# Patient Record
Sex: Female | Born: 1975
Health system: Southern US, Community
[De-identification: ages and names within clinical notes are randomized; demographics above are authoritative.]

## PROBLEM LIST (undated history)

## (undated) DIAGNOSIS — J069 Acute upper respiratory infection, unspecified: Secondary | ICD-10-CM

## (undated) DIAGNOSIS — J189 Pneumonia, unspecified organism: Secondary | ICD-10-CM

## (undated) DIAGNOSIS — E079 Disorder of thyroid, unspecified: Secondary | ICD-10-CM

## (undated) DIAGNOSIS — R011 Cardiac murmur, unspecified: Secondary | ICD-10-CM

## (undated) DIAGNOSIS — J329 Chronic sinusitis, unspecified: Secondary | ICD-10-CM

## (undated) DIAGNOSIS — Z319 Encounter for procreative management, unspecified: Secondary | ICD-10-CM

## (undated) DIAGNOSIS — E119 Type 2 diabetes mellitus without complications: Secondary | ICD-10-CM

## (undated) HISTORY — PX: WISDOM TOOTH EXTRACTION: SHX21

---

## 2008-12-18 ENCOUNTER — Ambulatory Visit: Payer: Self-pay | Admitting: Obstetrics & Gynecology

## 2013-08-24 ENCOUNTER — Inpatient Hospital Stay: Payer: Self-pay

## 2013-08-24 LAB — PIH PROFILE
AST: 11 U/L — AB (ref 15–37)
Anion Gap: 8 (ref 7–16)
BUN: 4 mg/dL — AB (ref 7–18)
Calcium, Total: 8.4 mg/dL — ABNORMAL LOW (ref 8.5–10.1)
Chloride: 104 mmol/L (ref 98–107)
Co2: 27 mmol/L (ref 21–32)
Creatinine: 0.83 mg/dL (ref 0.60–1.30)
EGFR (African American): >60
EGFR (Non-African Amer.): >60
GLUCOSE: 127 mg/dL — AB (ref 65–99)
HCT: 30.5 % — ABNORMAL LOW (ref 35.0–47.0)
HGB: 10.3 g/dL — ABNORMAL LOW (ref 12.0–16.0)
MCH: 29.6 pg (ref 26.0–34.0)
MCHC: 33.8 g/dL (ref 32.0–36.0)
MCV: 88 fL (ref 80–100)
Osmolality: 276 (ref 275–301)
PLATELETS: 225 10*3/uL (ref 150–440)
POTASSIUM: 2.8 mmol/L — AB (ref 3.5–5.1)
RBC: 3.48 10*6/uL — AB (ref 3.80–5.20)
RDW: 13.3 % (ref 11.5–14.5)
Sodium: 139 mmol/L (ref 136–145)
Uric Acid: 4.4 mg/dL (ref 2.6–6.0)
WBC: 11.2 10*3/uL — ABNORMAL HIGH (ref 3.6–11.0)

## 2013-08-24 LAB — PROTEIN / CREATININE RATIO, URINE
CREATININE, URINE: 42.3 mg/dL (ref 30.0–125.0)
Protein, Random Urine: 5 mg/dL (ref 0–12)
Protein/Creat. Ratio: 118 mg/gCREAT (ref 0–200)

## 2013-08-25 LAB — BASIC METABOLIC PANEL
ANION GAP: 7 (ref 7–16)
BUN: 4 mg/dL — AB (ref 7–18)
CO2: 27 mmol/L (ref 21–32)
CREATININE: 0.76 mg/dL (ref 0.60–1.30)
Calcium, Total: 8.4 mg/dL — ABNORMAL LOW (ref 8.5–10.1)
Chloride: 105 mmol/L (ref 98–107)
EGFR (Non-African Amer.): >60
GLUCOSE: 83 mg/dL (ref 65–99)
Osmolality: 274 (ref 275–301)
Potassium: 3 mmol/L — ABNORMAL LOW (ref 3.5–5.1)
Sodium: 139 mmol/L (ref 136–145)

## 2013-08-26 LAB — HEMATOCRIT: HCT: 30.9 % — AB (ref 35.0–47.0)

## 2013-08-26 LAB — POTASSIUM: Potassium: 3.5 mmol/L (ref 3.5–5.1)

## 2014-10-09 NOTE — H&P (Signed)
L&D Evaluation:  History:  HPI 39 yo at 37+6 week seen in office with scotomata and h/a today. Pt has been followed for gest HTN , AMA , obesity, Anemia. BP on L+D 140s / 80-90s. K+ = 2.8   Medications aldomet 250 mg TID   Allergies NKDA   Social History none   Family History Non-Contributory   Exam:  Vital Signs BP >140/90   General no apparent distress   Mental Status clear   Chest clear   Heart normal sinus rhythm   Fetal Position vtx   Edema 1+   Pelvic tft/ 80/-3 vtx   Mebranes Intact   FHT normal rate with no decels, reactive   Ucx irregular   Impression:  Impression HTN  with CNS sx earlier resolved. Hypokalemia etiology?   Plan:  Plan admit and induce given her profound hypokalemia  and Htn . Cervidil tonight, PO K+   Electronic Signatures: Schermerhorn, Ihor Austinhomas J (MD)  (Signed 26-Mar-15 19:20)  Authored: L&D Evaluation   Last Updated: 26-Mar-15 19:20 by Suzy BouchardSchermerhorn, Thomas J (MD)

## 2016-10-13 ENCOUNTER — Observation Stay
Admission: EM | Admit: 2016-10-13 | Discharge: 2016-10-14 | DRG: 871 | Disposition: A | Discharge status: Home / Self Care | Payer: BLUE CROSS/BLUE SHIELD | Attending: Internal Medicine | Admitting: Internal Medicine

## 2016-10-13 ENCOUNTER — Encounter: Payer: Self-pay | Admitting: Emergency Medicine

## 2016-10-13 ENCOUNTER — Emergency Department: Payer: BLUE CROSS/BLUE SHIELD

## 2016-10-13 DIAGNOSIS — Z7984 Long term (current) use of oral hypoglycemic drugs: Secondary | ICD-10-CM

## 2016-10-13 DIAGNOSIS — J189 Pneumonia, unspecified organism: Secondary | ICD-10-CM | POA: Diagnosis present

## 2016-10-13 DIAGNOSIS — R509 Fever, unspecified: Secondary | ICD-10-CM

## 2016-10-13 DIAGNOSIS — Z79899 Other long term (current) drug therapy: Secondary | ICD-10-CM | POA: Diagnosis not present

## 2016-10-13 DIAGNOSIS — J069 Acute upper respiratory infection, unspecified: Secondary | ICD-10-CM | POA: Diagnosis present

## 2016-10-13 DIAGNOSIS — E282 Polycystic ovarian syndrome: Secondary | ICD-10-CM | POA: Diagnosis present

## 2016-10-13 DIAGNOSIS — R000 Tachycardia, unspecified: Secondary | ICD-10-CM

## 2016-10-13 DIAGNOSIS — E039 Hypothyroidism, unspecified: Secondary | ICD-10-CM | POA: Diagnosis present

## 2016-10-13 DIAGNOSIS — A419 Sepsis, unspecified organism: Secondary | ICD-10-CM | POA: Diagnosis not present

## 2016-10-13 DIAGNOSIS — E876 Hypokalemia: Secondary | ICD-10-CM | POA: Diagnosis present

## 2016-10-13 HISTORY — DX: Chronic sinusitis, unspecified: J32.9

## 2016-10-13 HISTORY — DX: Pneumonia, unspecified organism: J18.9

## 2016-10-13 HISTORY — DX: Acute upper respiratory infection, unspecified: J06.9

## 2016-10-13 LAB — CBC WITH DIFFERENTIAL/PLATELET
Basophils Absolute: 0.1 10*3/uL (ref 0–0.1)
Basophils Relative: 1 %
Eosinophils Absolute: 0.5 10*3/uL (ref 0–0.7)
Eosinophils Relative: 4 %
HEMATOCRIT: 40.6 % (ref 35.0–47.0)
HEMOGLOBIN: 13.8 g/dL (ref 12.0–16.0)
LYMPHS ABS: 1.7 10*3/uL (ref 1.0–3.6)
LYMPHS PCT: 14 %
MCH: 30.2 pg (ref 26.0–34.0)
MCHC: 33.9 g/dL (ref 32.0–36.0)
MCV: 89 fL (ref 80.0–100.0)
MONOS PCT: 7 %
Monocytes Absolute: 0.9 10*3/uL (ref 0.2–0.9)
NEUTROS ABS: 9.5 10*3/uL — AB (ref 1.4–6.5)
NEUTROS PCT: 74 %
Platelets: 293 10*3/uL (ref 150–440)
RBC: 4.56 MIL/uL (ref 3.80–5.20)
RDW: 13.3 % (ref 11.5–14.5)
WBC: 12.8 10*3/uL — AB (ref 3.6–11.0)

## 2016-10-13 LAB — URINALYSIS, COMPLETE (UACMP) WITH MICROSCOPIC
Bacteria, UA: NONE SEEN
Bilirubin Urine: NEGATIVE
GLUCOSE, UA: NEGATIVE mg/dL
KETONES UR: NEGATIVE mg/dL
LEUKOCYTES UA: NEGATIVE
Nitrite: NEGATIVE
PROTEIN: NEGATIVE mg/dL
Specific Gravity, Urine: 1.013 (ref 1.005–1.030)
pH: 6 (ref 5.0–8.0)

## 2016-10-13 LAB — BASIC METABOLIC PANEL
ANION GAP: 9 (ref 5–15)
BUN: 7 mg/dL (ref 6–20)
CHLORIDE: 102 mmol/L (ref 101–111)
CO2: 25 mmol/L (ref 22–32)
CREATININE: 0.57 mg/dL (ref 0.44–1.00)
Calcium: 8.7 mg/dL — ABNORMAL LOW (ref 8.9–10.3)
GFR calc non Af Amer: >60 mL/min (ref >60)
Glucose, Bld: 128 mg/dL — ABNORMAL HIGH (ref 65–99)
POTASSIUM: 3.5 mmol/L (ref 3.5–5.1)
SODIUM: 136 mmol/L (ref 135–145)

## 2016-10-13 LAB — POCT PREGNANCY, URINE: PREG TEST UR: NEGATIVE

## 2016-10-13 LAB — INFLUENZA PANEL BY PCR (TYPE A & B)
INFLBPCR: NEGATIVE
Influenza A By PCR: NEGATIVE

## 2016-10-13 LAB — LACTIC ACID, PLASMA: LACTIC ACID, VENOUS: 2.5 mmol/L — AB (ref 0.5–1.9)

## 2016-10-13 MED ORDER — VANCOMYCIN HCL IN DEXTROSE 1-5 GM/200ML-% IV SOLN
1000.0000 mg | Freq: Once | INTRAVENOUS | Status: AC
  Administered 2016-10-14: 1000 mg via INTRAVENOUS
  Filled 2016-10-13: qty 200

## 2016-10-13 MED ORDER — PIPERACILLIN-TAZOBACTAM 3.375 G IVPB 30 MIN
3.3750 g | Freq: Once | INTRAVENOUS | Status: AC
  Administered 2016-10-14: 3.375 g via INTRAVENOUS
  Filled 2016-10-13: qty 50

## 2016-10-13 MED ORDER — SODIUM CHLORIDE 0.9 % IV BOLUS (SEPSIS)
1000.0000 mL | Freq: Once | INTRAVENOUS | Status: AC
  Administered 2016-10-13: 1000 mL via INTRAVENOUS

## 2016-10-13 MED ORDER — ACETAMINOPHEN 500 MG PO TABS
1000.0000 mg | ORAL_TABLET | Freq: Once | ORAL | Status: AC
  Administered 2016-10-13: 1000 mg via ORAL
  Filled 2016-10-13: qty 2

## 2016-10-13 NOTE — ED Notes (Signed)
ED Provider at bedside. 

## 2016-10-13 NOTE — ED Notes (Signed)
Pt reports a month ago she had a sinus infection that developed into an upper resp infection which led to "a touch of pneumonia." Pt states she was put on antibiotics as well as steroids and reports improvement in symptoms. Pt states today she developed a fever today as well as being achy and increased weakness. [facility] and [facility] to ED for elevated BP, pt also sinus tach.

## 2016-10-13 NOTE — ED Notes (Signed)
Pt states she would like get an update from EDP, EDP notified and states he will be in to see pt. Pt notified of this.

## 2016-10-13 NOTE — ED Provider Notes (Signed)
[facility] Emergency Department Provider Note   ____________________________________________   I have reviewed the triage vital signs and the nursing notes.   HISTORY  Chief Complaint Hypertension; Fever; Generalized Body Aches; and Chills   History limited by: Not Limited   HPI Theresa Skinner is a 41 y.o. female who presents to the emergency department today because of concerns for high blood pressure noted at her noted a clinic. The patient went to coming out of clinic today because of concerns for fevers and body aches. The patient states that the symptoms started this afternoon. She had been feeling well earlier in the day. The first symptom she noticed was chills. This was then accompanied by diffuse body aches. It is located in all 4 extremities. She is also had fevers. She denies taking any medication for fevers. She states that she does camp however has not noticed any ticks. No known sick contacts. She denies any dysuria or rashes.   History reviewed. No pertinent past medical history.  There are no active problems to display for this patient.   History reviewed. No pertinent surgical history.  Prior to Admission medications   Not on File    Allergies Patient has no known allergies.  No family history on file.  Social History Social History  Substance Use Topics  . Smoking status: Never Smoker  . Smokeless tobacco: Never Used  . Alcohol use Yes    Review of Systems Constitutional: Positive for fever and chills. Eyes: No visual changes. ENT: No sore throat. Cardiovascular: Denies chest pain. Respiratory: Denies shortness of breath. Gastrointestinal: No abdominal pain.  No nausea, no vomiting.  No diarrhea.   Genitourinary: Negative for dysuria. Musculoskeletal: Positive for myalgias.  Skin: Negative for rash. Neurological: Negative for headaches, focal weakness or  numbness.  ____________________________________________   PHYSICAL EXAM:  VITAL SIGNS: ED Triage Vitals  Enc Vitals Group     BP 10/13/16 1942 (!) 151/98     Pulse Rate 10/13/16 1942 (!) 143     Resp 10/13/16 1942 18     Temp 10/13/16 1942 99.5 F (37.5 C)     Temp Source 10/13/16 1942 Oral     SpO2 10/13/16 1942 98 %     Weight 10/13/16 1944 268 lb (121.6 kg)     Height 10/13/16 1944 5\' 6"  (1.676 m)     Head Circumference --      Peak Flow --      Pain Score 10/13/16 1944 6   Constitutional: Alert and oriented. Well appearing and in no distress. Eyes: Conjunctivae are normal.  ENT   Head: Normocephalic and atraumatic.   Nose: No congestion/rhinnorhea.   Mouth/Throat: Mucous membranes are moist.   Neck: No stridor. Hematological/Lymphatic/Immunilogical: No cervical lymphadenopathy. Cardiovascular: Tachycardic, regular rhythm.  No murmurs, rubs, or gallops. Respiratory: Normal respiratory effort without tachypnea nor retractions. Breath sounds are clear and equal bilaterally. No wheezes/rales/rhonchi. Gastrointestinal: Soft and non tender. No rebound. No guarding.  Genitourinary: Deferred Musculoskeletal: Normal range of motion in all extremities. No lower extremity edema. Neurologic:  Normal speech and language. No gross focal neurologic deficits are appreciated.  Skin:  Skin is warm, dry and intact. No rash noted. Psychiatric: Mood and affect are normal. Speech and behavior are normal. Patient exhibits appropriate insight and judgment.  ____________________________________________    LABS (pertinent positives/negatives)  Labs Reviewed  BASIC METABOLIC PANEL - Abnormal; Notable for the following:       Result Value   Glucose, Bld  128 (*)    Calcium 8.7 (*)    All other components within normal limits  CBC WITH DIFFERENTIAL/PLATELET - Abnormal; Notable for the following:    WBC 12.8 (*)    Neutro Abs 9.5 (*)    All other components within normal limits   LACTIC ACID, PLASMA - Abnormal; Notable for the following:    Lactic Acid, Venous 2.5 (*)    All other components within normal limits  URINALYSIS, COMPLETE (UACMP) WITH MICROSCOPIC - Abnormal; Notable for the following:    Color, Urine YELLOW (*)    APPearance CLEAR (*)    Hgb urine dipstick SMALL (*)    Squamous Epithelial / LPF 0-5 (*)    All other components within normal limits  CULTURE, BLOOD (ROUTINE X 2)  CULTURE, BLOOD (ROUTINE X 2)  INFLUENZA PANEL BY PCR (TYPE A & B)  LACTIC ACID, PLASMA  POCT PREGNANCY, URINE     ____________________________________________   EKG  I, Phineas SemenGraydon Maurene Hollin, attending physician, personally viewed and interpreted this EKG  EKG Time: 1945 Rate: 138 Rhythm: sinus tachycardia Axis: normal Intervals: qtc 412 QRS: narrow ST changes: no st elevation Impression: abnormal ekg   ____________________________________________    RADIOLOGY  CXR IMPRESSION:  No active cardiopulmonary disease.      ____________________________________________   PROCEDURES  Procedures  ____________________________________________   INITIAL IMPRESSION / ASSESSMENT AND PLAN / ED COURSE  Pertinent labs & imaging results that were available during my care of the patient were reviewed by me and considered in my medical decision making (see chart for details).  Patient presents to the emergency department today because of concerns for fevers and myalgias. Patient was tachycardic and febrile initial presentation. Additionally patient's lactic acid and leukocytosis for elevated. Unclear source at this point. Patient will be given broad-spectrum antibiotics. Patient will be given IV fluids and admitted to the hospitalist service.  ____________________________________________   FINAL CLINICAL IMPRESSION(S) / ED DIAGNOSES  Final diagnoses:  Fever, unspecified fever cause  Tachycardia     Note: This dictation was prepared with Dragon dictation. Any  transcriptional errors that result from this process are unintentional     Phineas SemenGoodman, Kian Gamarra, MD 10/13/16 2336

## 2016-10-13 NOTE — ED Triage Notes (Signed)
[facility]. Pt states she was initially seen for fever chills and body aches; onset earlier today. Denies chest pain or cough. Has not taken any otc medications today for her symptoms.

## 2016-10-14 ENCOUNTER — Encounter: Payer: Self-pay | Admitting: *Deleted

## 2016-10-14 DIAGNOSIS — A419 Sepsis, unspecified organism: Secondary | ICD-10-CM | POA: Diagnosis present

## 2016-10-14 LAB — BASIC METABOLIC PANEL
ANION GAP: 6 (ref 5–15)
BUN: 6 mg/dL (ref 6–20)
CALCIUM: 8.1 mg/dL — AB (ref 8.9–10.3)
CHLORIDE: 106 mmol/L (ref 101–111)
CO2: 26 mmol/L (ref 22–32)
CREATININE: 0.67 mg/dL (ref 0.44–1.00)
GFR calc Af Amer: >60 mL/min (ref >60)
GFR calc non Af Amer: >60 mL/min (ref >60)
GLUCOSE: 109 mg/dL — AB (ref 65–99)
Potassium: 3.3 mmol/L — ABNORMAL LOW (ref 3.5–5.1)
Sodium: 138 mmol/L (ref 135–145)

## 2016-10-14 LAB — CBC
HCT: 37.1 % (ref 35.0–47.0)
HEMOGLOBIN: 12.7 g/dL (ref 12.0–16.0)
MCH: 29.9 pg (ref 26.0–34.0)
MCHC: 34.2 g/dL (ref 32.0–36.0)
MCV: 87.3 fL (ref 80.0–100.0)
Platelets: 273 10*3/uL (ref 150–440)
RBC: 4.25 MIL/uL (ref 3.80–5.20)
RDW: 13.1 % (ref 11.5–14.5)
WBC: 9.4 10*3/uL (ref 3.6–11.0)

## 2016-10-14 LAB — PROTIME-INR
INR: 1.03
Prothrombin Time: 13.5 seconds (ref 11.4–15.2)

## 2016-10-14 LAB — LACTIC ACID, PLASMA
LACTIC ACID, VENOUS: 2.8 mmol/L — AB (ref 0.5–1.9)
Lactic Acid, Venous: 1.3 mmol/L (ref 0.5–1.9)

## 2016-10-14 LAB — APTT: aPTT: 28 seconds (ref 24–36)

## 2016-10-14 LAB — PROCALCITONIN

## 2016-10-14 MED ORDER — PIPERACILLIN-TAZOBACTAM 4.5 G IVPB
4.5000 g | Freq: Three times a day (TID) | INTRAVENOUS | Status: DC
  Administered 2016-10-14: 4.5 g via INTRAVENOUS
  Filled 2016-10-14 (×3): qty 100

## 2016-10-14 MED ORDER — VANCOMYCIN HCL IN DEXTROSE 1-5 GM/200ML-% IV SOLN: 1000.0000 mg | Freq: Once | INTRAVENOUS | Status: DC

## 2016-10-14 MED ORDER — ALBUTEROL SULFATE (2.5 MG/3ML) 0.083% IN NEBU
2.5000 mg | INHALATION_SOLUTION | Freq: Four times a day (QID) | RESPIRATORY_TRACT | Status: DC | PRN
Start: 2016-10-14 — End: 2016-10-14

## 2016-10-14 MED ORDER — SODIUM CHLORIDE 0.9 % IV SOLN
INTRAVENOUS | Status: DC
  Administered 2016-10-14: 03:00:00 via INTRAVENOUS

## 2016-10-14 MED ORDER — OXYCODONE HCL 5 MG PO TABS: 5.0000 mg | ORAL_TABLET | ORAL | Status: DC | PRN

## 2016-10-14 MED ORDER — ONDANSETRON HCL 4 MG/2ML IJ SOLN: 4.0000 mg | Freq: Four times a day (QID) | INTRAMUSCULAR | Status: DC | PRN

## 2016-10-14 MED ORDER — SENNOSIDES-DOCUSATE SODIUM 8.6-50 MG PO TABS: 1.0000 | ORAL_TABLET | Freq: Every evening | ORAL | Status: DC | PRN

## 2016-10-14 MED ORDER — ONDANSETRON HCL 4 MG PO TABS: 4.0000 mg | ORAL_TABLET | Freq: Four times a day (QID) | ORAL | Status: DC | PRN

## 2016-10-14 MED ORDER — METFORMIN HCL 500 MG PO TABS
2000.0000 mg | ORAL_TABLET | Freq: Every day | ORAL | Status: DC
  Administered 2016-10-14: 2000 mg via ORAL
  Filled 2016-10-14: qty 4

## 2016-10-14 MED ORDER — SODIUM CHLORIDE 0.9 % IV SOLN
1250.0000 mg | Freq: Three times a day (TID) | INTRAVENOUS | Status: DC
  Filled 2016-10-14: qty 1250

## 2016-10-14 MED ORDER — MAGNESIUM CITRATE PO SOLN
1.0000 | Freq: Once | ORAL | Status: DC | PRN
  Filled 2016-10-14: qty 296

## 2016-10-14 MED ORDER — ACETAMINOPHEN 325 MG PO TABS: 650.0000 mg | ORAL_TABLET | Freq: Four times a day (QID) | ORAL | Status: DC | PRN

## 2016-10-14 MED ORDER — PIPERACILLIN-TAZOBACTAM 3.375 G IVPB 30 MIN: 3.3750 g | Freq: Once | INTRAVENOUS | Status: DC

## 2016-10-14 MED ORDER — PIPERACILLIN-TAZOBACTAM 3.375 G IVPB
3.3750 g | Freq: Three times a day (TID) | INTRAVENOUS | Status: DC
  Filled 2016-10-14 (×2): qty 50

## 2016-10-14 MED ORDER — IPRATROPIUM BROMIDE 0.02 % IN SOLN: 0.5000 mg | Freq: Four times a day (QID) | RESPIRATORY_TRACT | Status: DC | PRN

## 2016-10-14 MED ORDER — LEVOTHYROXINE SODIUM 75 MCG PO TABS
75.0000 ug | ORAL_TABLET | Freq: Every day | ORAL | Status: DC
  Administered 2016-10-14: 75 ug via ORAL
  Filled 2016-10-14: qty 1

## 2016-10-14 MED ORDER — DOXYCYCLINE HYCLATE 100 MG PO CAPS: 100.0000 mg | ORAL_CAPSULE | Freq: Two times a day (BID) | ORAL | 0 refills | Status: AC

## 2016-10-14 MED ORDER — BISACODYL 5 MG PO TBEC: 5.0000 mg | DELAYED_RELEASE_TABLET | Freq: Every day | ORAL | Status: DC | PRN

## 2016-10-14 MED ORDER — ACETAMINOPHEN 650 MG RE SUPP: 650.0000 mg | Freq: Four times a day (QID) | RECTAL | Status: DC | PRN

## 2016-10-14 NOTE — Care Management (Signed)
Patient provided list of accepting PCP

## 2016-10-14 NOTE — ED Notes (Signed)
Pt reports camping recently, denies seeing ticks on herself however reports her mother had 2 ticks on her and her son had 1. Pt denies rashes on self.

## 2016-10-14 NOTE — Progress Notes (Signed)
Pharmacy Antibiotic Note  Theresa Skinner is a 41 y.o. female admitted on 10/13/2016 with sepsis.  Pharmacy has been consulted for Zosyn dosing.  Plan: Patient with normalized WBC, is afebrile, and procalcitonin < 0.1; will continue Zosyn 3.375g IV Q8hr.   Height: 5\' 6"  (167.6 cm) Weight: 280 lb 4.8 oz (127.1 kg) IBW/kg (Calculated) : 59.3  Temp (24hrs), Avg:98.8 F (37.1 C), Min:98.1 F (36.7 C), Max:99.5 F (37.5 C)   Recent Labs Lab 10/13/16 1957 10/13/16 2010 10/13/16 2332 10/14/16 0317  WBC 12.8*  --   --  9.4  CREATININE 0.57  --   --  0.67  LATICACIDVEN  --  2.5* 2.8* 1.3    Estimated Creatinine Clearance: 127.5 mL/min (by C-G formula based on SCr of 0.67 mg/dL).    No Known Allergies  Antimicrobials this admission: Vancomycin 5/16 >> 5/16 Zosyn 5/16  >>    Dose adjustments this admission: 5/16 Zosyn Transitioned to 3.375g   Microbiology results: 5/16 BCx: pending 5/16 Sputum: pending 5/16 Procalcitonin: < 0.1 5/15: CXR no active disease 5/15 UA: (-)  Thank you for allowing pharmacy to be a part of this patient's care.  Simpson,Michael L 10/14/2016 8:58 AM

## 2016-10-14 NOTE — H&P (Signed)
History and Physical   [facility] [place] @ [facility] Admission History and Physical [employer], D.O.    Patient Name: Theresa Skinner MR#: [id number] Date of Birth: 21-Aug-1975 Date of Admission: 10/13/2016  Chief Complaint:  Chief Complaint  Patient presents with  . Hypertension  . Fever  . Generalized Body Aches  . Chills    HPI: Theresa Skinner is a 41 y.o. female with no known past medical history presents To the emergency department for a one-day history of generalized malaise, diffuse myalgias and fevers at home. She presented to urgent care this afternoon and was referred here because of hypertension. Patient denies  dizziness, chest pain, shortness of breath, N/V/C/D, abdominal pain, dysuria/frequency, changes in mental status. Of note she has been camping recently but has not noticed any bug bites or ticks. 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   Otherwise there has been no change in status. There has been no recent change in medication or diet.  No recent antibiotics.  There has been no recent illness, hospitalizations, travel or sick contacts.    EMS/ED Course: Patient received Zosyn, Vanco mycin, IV fluids.  Review of Systems:  CONSTITUTIONAL: Positive fever/chills, fatigue, weakness, negative weight gain/loss, headache. EYES: No blurry or double vision. ENT: No tinnitus, postnasal drip, redness or soreness of the oropharynx. RESPIRATORY: No cough, dyspnea, wheeze.  No hemoptysis.  CARDIOVASCULAR: No chest pain, palpitations, syncope, orthopnea. No lower extremity edema.  GASTROINTESTINAL: No nausea, vomiting, abdominal pain, diarrhea, constipation.  No hematemesis, melena or hematochezia. GENITOURINARY: No dysuria, frequency, hematuria. ENDOCRINE: No polyuria or nocturia. No heat or cold intolerance. HEMATOLOGY: No anemia, bruising, bleeding. INTEGUMENTARY: No rashes, ulcers, lesions. MUSCULOSKELETAL: No arthritis, gout, dyspnea. Positive  myalgias NEUROLOGIC: No numbness, tingling, ataxia, seizure-type activity, weakness. PSYCHIATRIC: No anxiety, depression, insomnia.   History reviewed. No pertinent past medical history.  History reviewed. No pertinent surgical history.   reports that she has never smoked. She has never used smokeless tobacco. She reports that she drinks alcohol. She reports that she does not use drugs.  No Known Allergies  No family history on file.  Prior to Admission medications   Medication Sig Start Date End Date Taking? Authorizing Provider  fluticasone (FLONASE) 50 MCG/ACT nasal spray Place 1 spray into both nostrils 2 (two) times daily. 08/28/16  Yes [provider]  levalbuterol (XOPENEX HFA) 45 MCG/ACT inhaler Inhale 2 puffs into the lungs every 4 (four) hours as needed for wheezing. 09/16/16  Yes [provider]  levothyroxine (SYNTHROID, LEVOTHROID) 75 MCG tablet Take 75 mcg by mouth daily. 08/11/16  Yes [provider]  [name] 0.15-30 MG-MCG tablet Take 1 tablet by mouth daily. Take 1 tablet by mouth daily on days 1-21, discard placebos and start a new pack on day 22. 10/03/16  Yes [provider]  metFORMIN (GLUCOPHAGE) 1000 MG tablet Take 2,000 mg by mouth daily. 08/09/16  Yes [provider]  PROAIR HFA 108 (90 Base) MCG/ACT inhaler Inhale 2 puffs into the lungs every 4 (four) hours as needed for wheezing. 09/12/16  Yes [provider]  Vitamin D, Ergocalciferol, (DRISDOL) 50000 units CAPS capsule Take 50,000 Units by mouth once a week. 08/25/16  Yes [provider]    Physical Exam: Vitals:   10/13/16 2300 10/13/16 2330 10/14/16 0030 10/14/16 0100  BP: 136/90 (!) 152/95 131/84 137/88  Pulse: (!) 107 (!) 107 (!) 107 (!) 114  Resp: 18 (!) 22 (!) 21 20  Temp:  TempSrc:      SpO2: 96% 96% 94% 95%  Weight:      Height:        GENERAL: 41 y.o.-year-old White female patient, well-developed, well-nourished lying in the bed in  no acute distress.  Pleasant and cooperative.   HEENT: Head atraumatic, normocephalic. Pupils equal, round, reactive to Grigg and accommodation. No scleral icterus. Extraocular muscles intact. Nares are patent. Oropharynx is clear. Mucus membranes moist. NECK: Supple, full range of motion. No JVD, no bruit heard. No thyroid enlargement, no tenderness, no cervical lymphadenopathy. CHEST: Normal breath sounds bilaterally. No wheezing, rales, rhonchi or crackles. No use of accessory muscles of respiration.  No reproducible chest wall tenderness.  CARDIOVASCULAR: S1, S2 normal. No murmurs, rubs, or gallops. Cap refill <2 seconds. Pulses intact distally.  ABDOMEN: Soft, nondistended, nontender. No rebound, guarding, rigidity. Normoactive bowel sounds present in all four quadrants. No organomegaly or mass. EXTREMITIES: No pedal edema, cyanosis, or clubbing. No calf tenderness or Homan's sign.  NEUROLOGIC: The patient is alert and oriented x 3. Cranial nerves II through XII are grossly intact with no focal sensorimotor deficit. Muscle strength 5/5 in all extremities. Sensation intact. Gait not checked. PSYCHIATRIC:  Normal affect, mood, thought content. SKIN: Warm, dry, and intact without obvious rash, lesion, or ulcer.    Labs on Admission:  CBC:  Recent Labs Lab 10/13/16 1957  WBC 12.8*  NEUTROABS 9.5*  HGB 13.8  HCT 40.6  MCV 89.0  PLT 293   Basic Metabolic Panel:  Recent Labs Lab 10/13/16 1957  NA 136  K 3.5  CL 102  CO2 25  GLUCOSE 128*  BUN 7  CREATININE 0.57  CALCIUM 8.7*   GFR: Estimated Creatinine Clearance: 124.3 mL/min (by C-G formula based on SCr of 0.57 mg/dL). Liver Function Tests: No results for input(s): AST, ALT, ALKPHOS, BILITOT, PROT, ALBUMIN in the last 168 hours. No results for input(s): LIPASE, AMYLASE in the last 168 hours. No results for input(s): AMMONIA in the last 168 hours. Coagulation Profile: No results for input(s): INR, PROTIME in the last 168  hours. Cardiac Enzymes: No results for input(s): CKTOTAL, CKMB, CKMBINDEX, TROPONINI in the last 168 hours. BNP (last 3 results) No results for input(s): PROBNP in the last 8760 hours. HbA1C: No results for input(s): HGBA1C in the last 72 hours. CBG: No results for input(s): GLUCAP in the last 168 hours. Lipid Profile: No results for input(s): CHOL, HDL, LDLCALC, TRIG, CHOLHDL, LDLDIRECT in the last 72 hours. Thyroid Function Tests: No results for input(s): TSH, T4TOTAL, FREET4, T3FREE, THYROIDAB in the last 72 hours. Anemia Panel: No results for input(s): VITAMINB12, FOLATE, FERRITIN, TIBC, IRON, RETICCTPCT in the last 72 hours. Urine analysis:    Component Value Date/Time   COLORURINE YELLOW (A) 10/13/2016 2031   APPEARANCEUR CLEAR (A) 10/13/2016 2031   LABSPEC 1.013 10/13/2016 2031   PHURINE 6.0 10/13/2016 2031   GLUCOSEU NEGATIVE 10/13/2016 2031   HGBUR SMALL (A) 10/13/2016 2031   BILIRUBINUR NEGATIVE 10/13/2016 2031   KETONESUR NEGATIVE 10/13/2016 2031   PROTEINUR NEGATIVE 10/13/2016 2031   NITRITE NEGATIVE 10/13/2016 2031   LEUKOCYTESUR NEGATIVE 10/13/2016 2031   Sepsis Labs: @LABRCNTIP (procalcitonin:4,lacticidven:4) )No results found for this or any previous visit (from the past 240 hour(s)).   Radiological Exams on Admission: Dg Chest 2 View  Result Date: 10/13/2016 CLINICAL DATA:  Hypertension, fever chills and body ache EXAM: CHEST  2 VIEW COMPARISON:  None. FINDINGS: The heart size and mediastinal contours are within normal limits. Both  lungs are clear. The visualized skeletal structures are unremarkable. IMPRESSION: No active cardiopulmonary disease. Electronically Signed   By: Jasmine PangKim  Fujinaga M.D.   On: 10/13/2016 22:30    EKG: Sinus tachycardia at 138 bpm with normal axis and nonspecific ST-T wave changes.   Assessment/Plan  This is a 41 y.o. female with  now being a no known medical history a dmitted with:  #. Sepsis secondary to unclear source, possibly  viral illness - Admit to inpatient with telemetry monitoring - IV antibiotics: Zosyn and vancomycin - IV fluid hydration - Follow up blood,urine & sputum cultures - Repeat CBC in am.   #. History of PCO S -Continue metformin  #.history of hypothyroidism -Continue levothyroxin  Admission: Inpatient IV Fluids: Normal saline Diet/Nutrition:  Regular Consults called: None   DVT Px: Lovenox, SCDs and early ambulation. Code Status: Full Code  Disposition Plan: To home in 1-2 days  All the records are reviewed and case discussed with ED provider. Management plans discussed with the patient and/or family who express understanding and agree with plan of care.  Atom Solivan D.O. on 10/14/2016 at 1:47 AM Between 7am to 6pm - Pager - [phone] After 6pm go to [email or web address] - password [email or web address] [facility] [place] [facility] [phone] CC: Primary care physician; Patient, No Pcp Per   10/14/2016, 1:47 AM

## 2016-10-14 NOTE — Discharge Summary (Signed)
[facility] [place] NAME: Theresa Skinner    MR#:  [id number]  DATE OF BIRTH:  20-Jun-1975  DATE OF ADMISSION:  10/13/2016 ADMITTING PHYSICIAN: Harvie Bridge, DO  DATE OF DISCHARGE: 10/14/2016  PRIMARY CARE PHYSICIAN: Dr. Freddi Che  ADMISSION DIAGNOSIS:  Tachycardia [R00.0] Fever, unspecified fever cause [R50.9]  DISCHARGE DIAGNOSIS:  Active Problems:   Sepsis ([place])   SECONDARY DIAGNOSIS:   Past Medical History:  Diagnosis Date  . Pneumonia   . Sinus infection   . Upper respiratory infection     HOSPITAL COURSE:  41 year old female with a past medical history who is admitted with sepsis.  1. Sepsis: Patient met criteria by elevated white blood cell count and tachycardia. Lactic acid level has normalized Viral in etiology as blood cultures are negative to date. Chest x-ray showed no evidence of pneumonia and urinalysis showed no evidence of UTI. Patient is afebrile. Patient will be discharged on doxycycline due to history of camping.  2. Hypokalemia: This was repleted prior to discharge  3. History of PCO S on metformin  4. Hypothyroidism: Continue Synthroid    DISCHARGE CONDITIONS AND DIET:  Stable for discharge on heart healthy diet  CONSULTS OBTAINED:    DRUG ALLERGIES:  No Known Allergies  DISCHARGE MEDICATIONS:   Current Discharge Medication List    START taking these medications   Details  doxycycline (VIBRAMYCIN) 100 MG capsule Take 1 capsule (100 mg total) by mouth 2 (two) times daily. Qty: 10 capsule, Refills: 0      CONTINUE these medications which have NOT CHANGED   Details  fluticasone (FLONASE) 50 MCG/ACT nasal spray Place 1 spray into both nostrils 2 (two) times daily. Refills: 0    levalbuterol (XOPENEX HFA) 45 MCG/ACT inhaler Inhale 2 puffs into the lungs every 4 (four) hours as needed for wheezing. Refills: 0    levothyroxine (SYNTHROID, LEVOTHROID) 75 MCG tablet Take 75 mcg by  mouth daily. Refills: 1    [name] 0.15-30 MG-MCG tablet Take 1 tablet by mouth daily. Take 1 tablet by mouth daily on days 1-21, discard placebos and start a new pack on day 22. Refills: 8    metFORMIN (GLUCOPHAGE) 1000 MG tablet Take 2,000 mg by mouth daily. Refills: 5    PROAIR HFA 108 (90 Base) MCG/ACT inhaler Inhale 2 puffs into the lungs every 4 (four) hours as needed for wheezing. Refills: 0    Vitamin D, Ergocalciferol, (DRISDOL) 50000 units CAPS capsule Take 50,000 Units by mouth once a week. Refills: 1          Today   CHIEF COMPLAINT:  Patient doing well this morning. No sinus or respiratory issues.   VITAL SIGNS:  Blood pressure 129/75, pulse 99, temperature 98.1 F (36.7 C), temperature source Oral, resp. rate 18, height _0  (1.676 m), weight 127.1 kg (280 lb 4.8 oz), last menstrual period 09/14/2016, SpO2 97 %.   REVIEW OF SYSTEMS:  Review of Systems  Constitutional: Negative.  Negative for chills, fever and malaise/fatigue.  HENT: Negative.  Negative for ear discharge, ear pain, hearing loss, nosebleeds and sore throat.   Eyes: Negative.  Negative for blurred vision and pain.  Respiratory: Negative.  Negative for cough, hemoptysis, shortness of breath and wheezing.   Cardiovascular: Negative.  Negative for chest pain, palpitations and leg swelling.  Gastrointestinal: Negative.  Negative for abdominal pain, blood in stool, diarrhea, nausea and vomiting.  Genitourinary: Negative.  Negative for dysuria.  Musculoskeletal: Negative.  Negative for back pain.  Skin: Negative.   Neurological: Negative for dizziness, tremors, speech change, focal weakness, seizures and headaches.  Endo/Heme/Allergies: Negative.  Does not bruise/bleed easily.  Psychiatric/Behavioral: Negative.  Negative for depression, hallucinations and suicidal ideas.     PHYSICAL EXAMINATION:  GENERAL:  41 y.o.-year-old patient lying in the bed with no acute distress.  NECK:  Supple, no  jugular venous distention. No thyroid enlargement, no tenderness.  LUNGS: Normal breath sounds bilaterally, no wheezing, rales,rhonchi  No use of accessory muscles of respiration.  CARDIOVASCULAR: S1, S2 normal. No murmurs, rubs, or gallops.  ABDOMEN: Soft, non-tender, non-distended. Bowel sounds present. No organomegaly or mass.  EXTREMITIES: No pedal edema, cyanosis, or clubbing.  PSYCHIATRIC: The patient is alert and oriented x 3.  SKIN: No obvious rash, lesion, or ulcer.   DATA REVIEW:   CBC  Recent Labs Lab 10/14/16 0317  WBC 9.4  HGB 12.7  HCT 37.1  PLT 273    Chemistries   Recent Labs Lab 10/14/16 0317  NA 138  K 3.3*  CL 106  CO2 26  GLUCOSE 109*  BUN 6  CREATININE 0.67  CALCIUM 8.1*    Cardiac Enzymes No results for input(s): TROPONINI in the last 168 hours.  Microbiology Results  _0 @  RADIOLOGY:  Dg Chest 2 View  Result Date: 10/13/2016 CLINICAL DATA:  Hypertension, fever chills and body ache EXAM: CHEST  2 VIEW COMPARISON:  None. FINDINGS: The heart size and mediastinal contours are within normal limits. Both lungs are clear. The visualized skeletal structures are unremarkable. IMPRESSION: No active cardiopulmonary disease. Electronically Signed   By: Donavan Foil M.D.   On: 10/13/2016 22:30      Current Discharge Medication List    START taking these medications   Details  doxycycline (VIBRAMYCIN) 100 MG capsule Take 1 capsule (100 mg total) by mouth 2 (two) times daily. Qty: 10 capsule, Refills: 0      CONTINUE these medications which have NOT CHANGED   Details  fluticasone (FLONASE) 50 MCG/ACT nasal spray Place 1 spray into both nostrils 2 (two) times daily. Refills: 0    levalbuterol (XOPENEX HFA) 45 MCG/ACT inhaler Inhale 2 puffs into the lungs every 4 (four) hours as needed for wheezing. Refills: 0    levothyroxine (SYNTHROID, LEVOTHROID) 75 MCG tablet Take 75 mcg by mouth daily. Refills: 1    [name] 0.15-30 MG-MCG  tablet Take 1 tablet by mouth daily. Take 1 tablet by mouth daily on days 1-21, discard placebos and start a new pack on day 22. Refills: 8    metFORMIN (GLUCOPHAGE) 1000 MG tablet Take 2,000 mg by mouth daily. Refills: 5    PROAIR HFA 108 (90 Base) MCG/ACT inhaler Inhale 2 puffs into the lungs every 4 (four) hours as needed for wheezing. Refills: 0    Vitamin D, Ergocalciferol, (DRISDOL) 50000 units CAPS capsule Take 50,000 Units by mouth once a week. Refills: 1            Management plans discussed with the patient and she is in agreement. Stable for discharge home  Patient should follow up with pcp  CODE STATUS:     Code Status Orders        Start     Ordered   10/14/16 0225  Full code  Continuous     10/14/16 0224    Code Status History    Date Active Date Inactive Code Status Order ID Comments User Context   This patient has a  current code status but no historical code status.      TOTAL TIME TAKING CARE OF THIS PATIENT: 37 minutes.    Note: This dictation was prepared with Dragon dictation along with smaller phrase technology. Any transcriptional errors that result from this process are unintentional.  Eura Radabaugh M.D on 10/14/2016 at 9:29 AM  Between 7am to 6pm - Pager - [phone] After 6pm go to [email or web address] - password [email or web address] [facility]  Office  [phone]  CC: Primary care physician;  Dr. Freddi Che (she will establish care)

## 2016-10-14 NOTE — Progress Notes (Signed)
Pharmacy Antibiotic Note  Theresa Skinner is a 41 y.o. female admitted on 10/13/2016 with sepsis.  Pharmacy has been consulted for vancomycin and Zosyn dosing.  Plan: DW 86 kg  Vd 60L kei 0.096 hr-1  t1/2 7 hours Vancomycin 1250 mg q 8 hours ordered with stacked dosing. Level before 5th dose. Goal trough 15-20.  Zosyn 4.5 grams q 8 hours ordered for TBW >120kg.  Height: 5\' 6"  (167.6 cm) Weight: 280 lb 4.8 oz (127.1 kg) IBW/kg (Calculated) : 59.3  Temp (24hrs), Avg:99 F (37.2 C), Min:98.6 F (37 C), Max:99.5 F (37.5 C)   Recent Labs Lab 10/13/16 1957 10/13/16 2010 10/13/16 2332  WBC 12.8*  --   --   CREATININE 0.57  --   --   LATICACIDVEN  --  2.5* 2.8*    Estimated Creatinine Clearance: 127.5 mL/min (by C-G formula based on SCr of 0.57 mg/dL).    No Known Allergies  Antimicrobials this admission: Vancomycin, Zosyn 5/16  >>    >>   Dose adjustments this admission:   Microbiology results: 5/16 BCx: pending 5/16 Sputum: pending      5/15: CXR no active disease 5/15 UA: (-)  Thank you for allowing pharmacy to be a part of this patient's care.  Didi Ganaway S 10/14/2016 3:05 AM

## 2016-10-14 NOTE — Progress Notes (Signed)
Patient alert and oriented, patient denies pain at this time. Discharge instructions given and patient is to follow up with a primary care physician. Telemetry discontinued, IV discontinued site clean dry and intact.

## 2016-10-15 LAB — HIV ANTIBODY (ROUTINE TESTING W REFLEX): HIV Screen 4th Generation wRfx: NONREACTIVE

## 2016-10-19 LAB — CULTURE, BLOOD (ROUTINE X 2)
CULTURE: NO GROWTH
Culture: NO GROWTH
Special Requests: ADEQUATE
Special Requests: ADEQUATE

## 2017-11-24 IMAGING — MG MM DIGITAL SCREENING BILAT W/ TOMO AND CAD
1 series · 8 of 8 positions shown · non-contrast
Comparison: Previous exam(s).

CLINICAL DATA: Screening.

EXAM:
DIGITAL SCREENING BILATERAL MAMMOGRAM WITH TOMOSYNTHESIS AND CAD
TECHNIQUE: Bilateral screening digital craniocaudal and mediolateral oblique
mammograms were obtained. Bilateral screening digital breast
tomosynthesis was performed. The images were evaluated with
computer-aided detection.

[R CC · right · 8 of 10 slices shown]
[im 1/10]
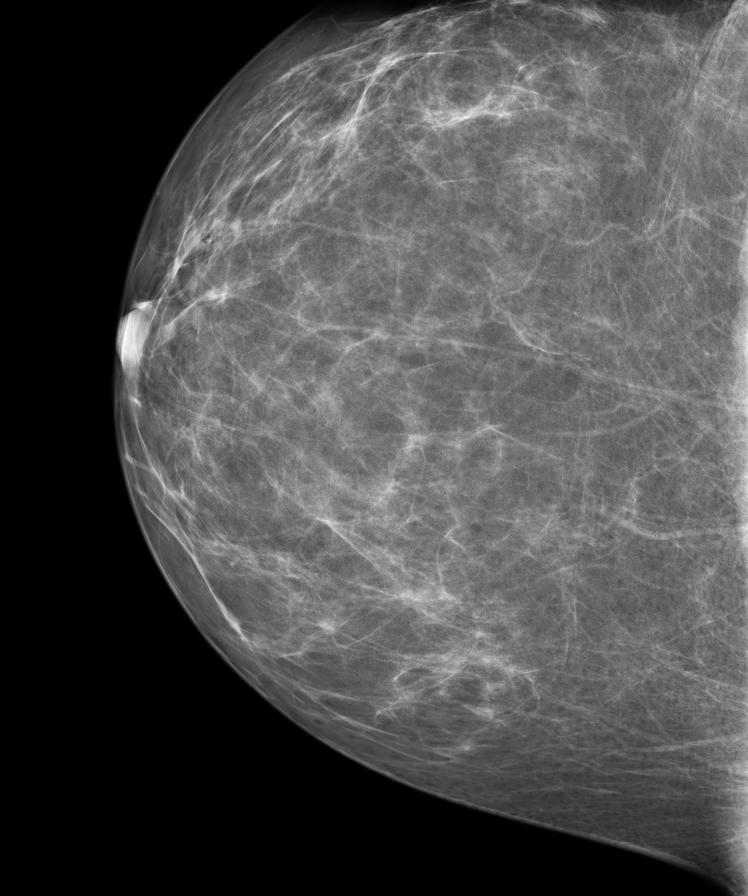
[im 2/10]
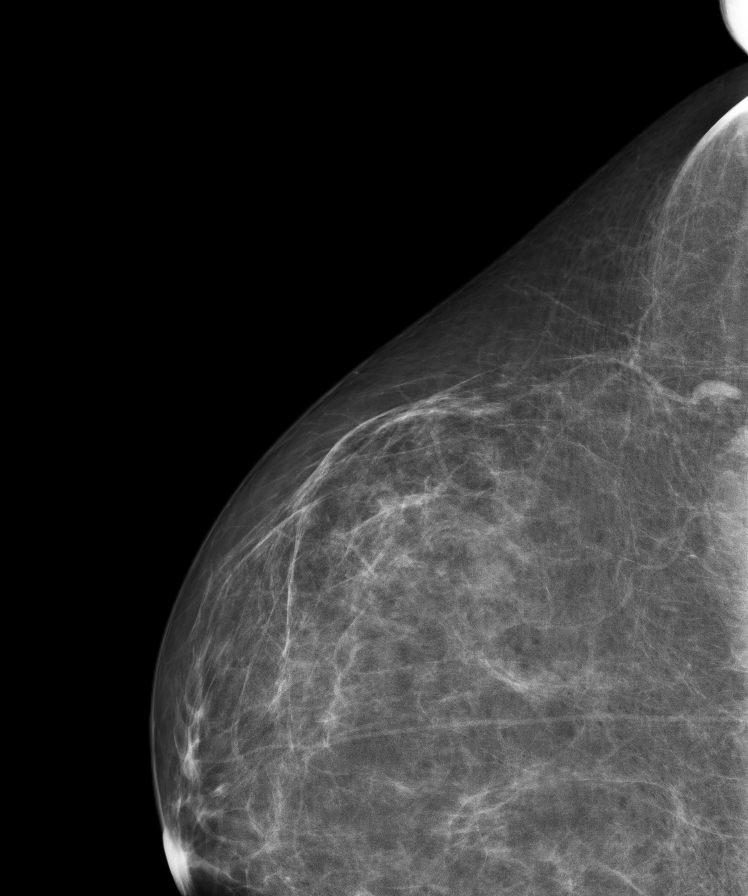
[im 3/10]
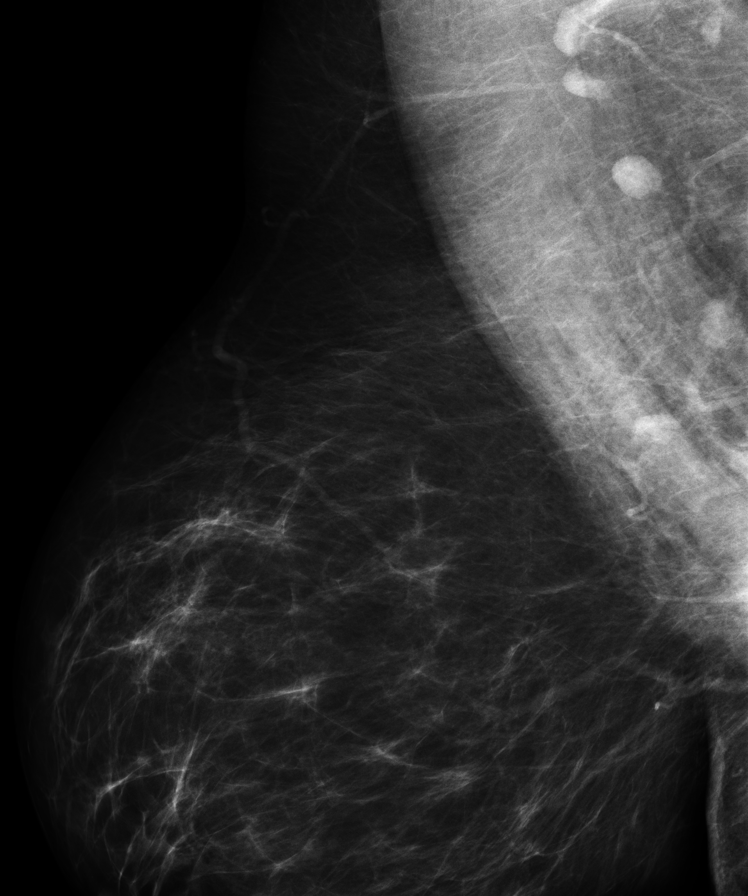
[im 4/10]
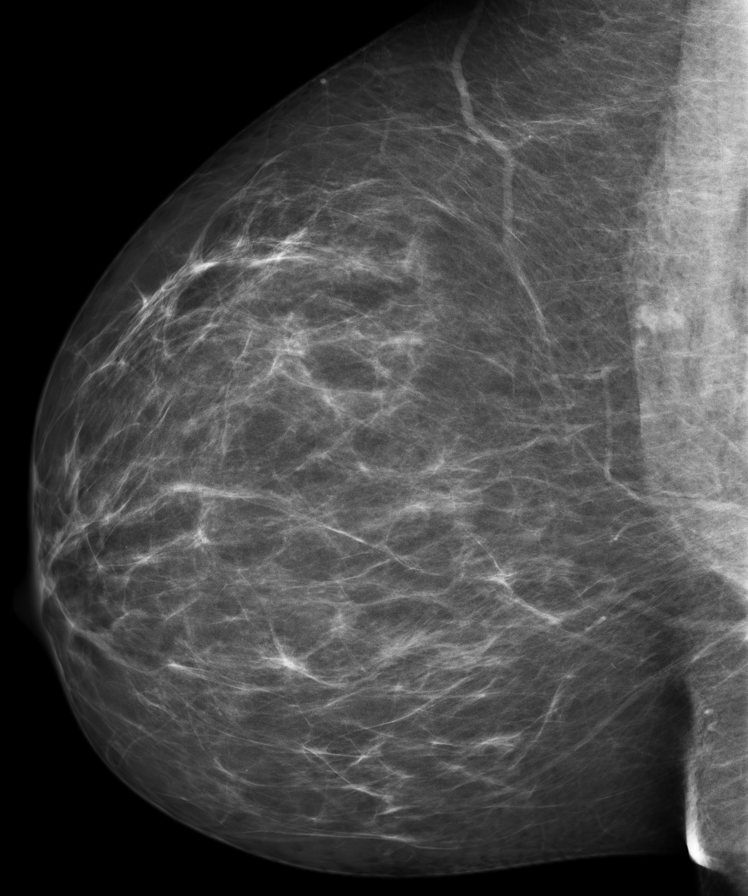
[im 6/10]
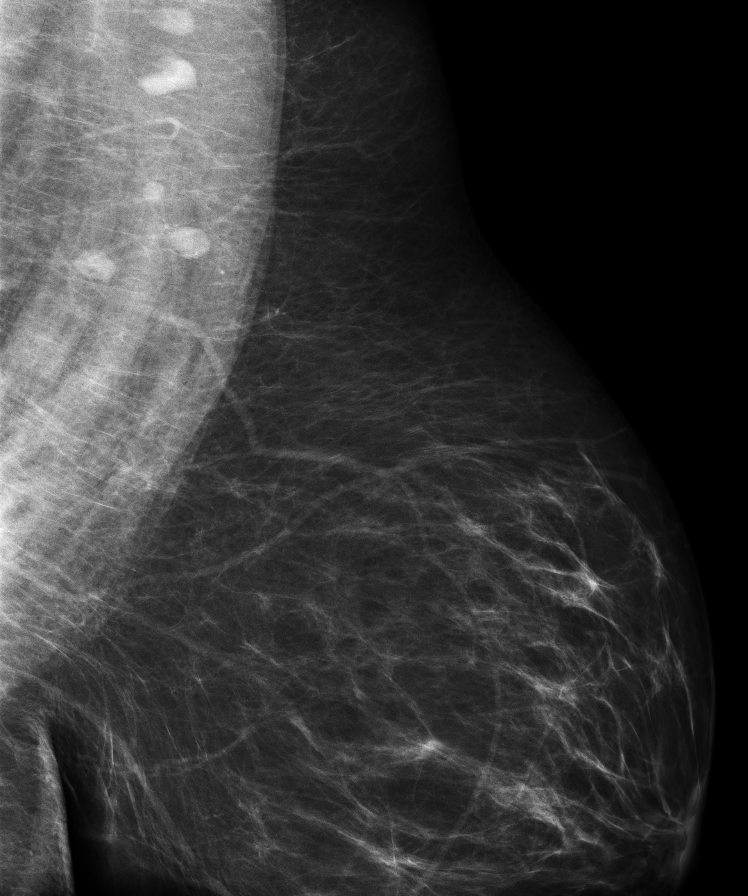
[im 7/10]
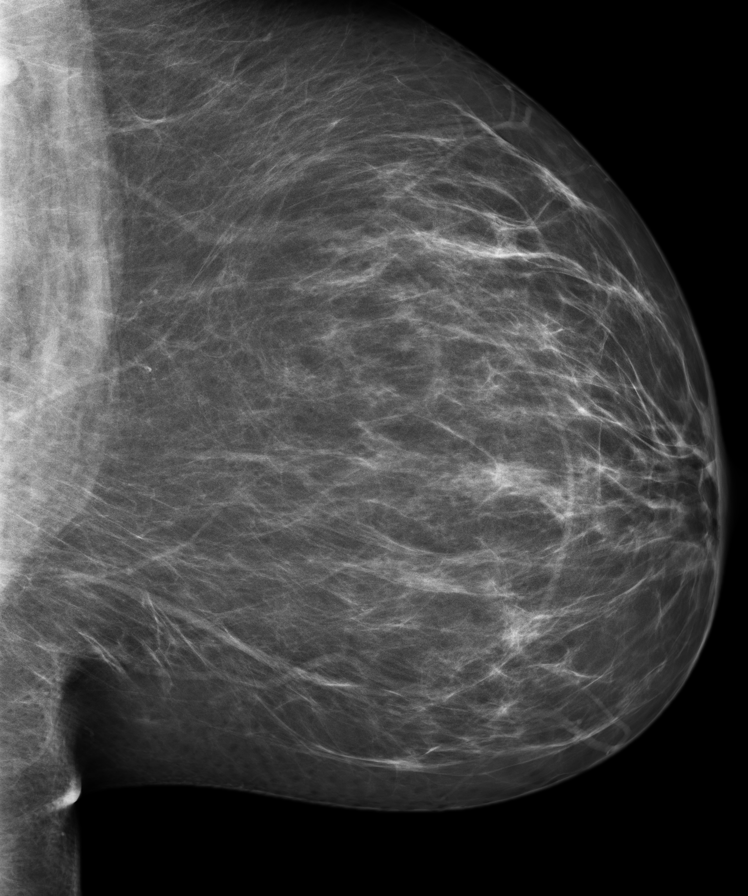
[im 8/10]
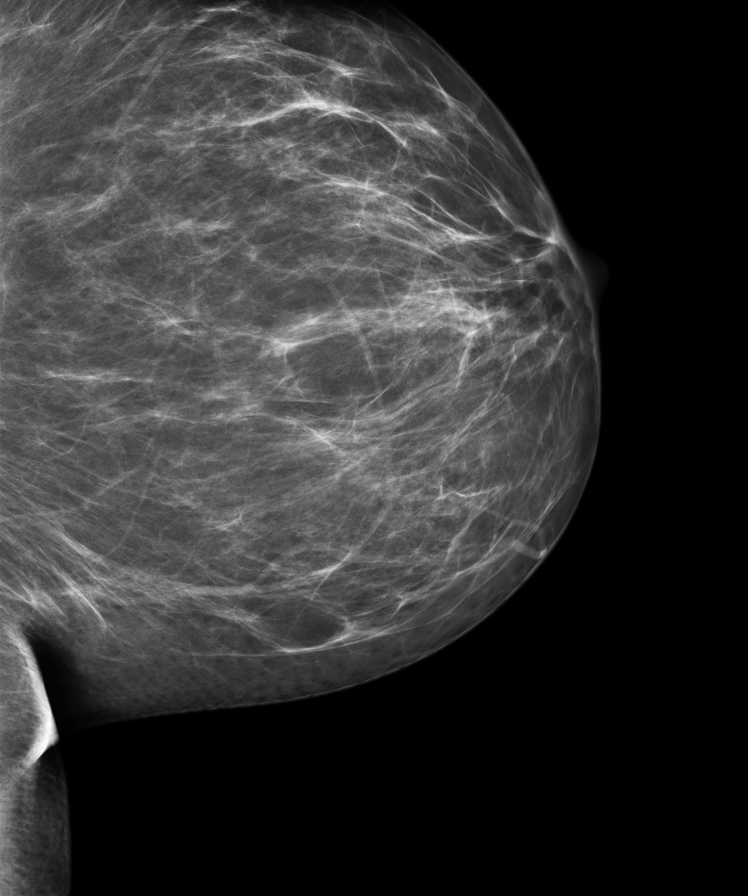
[im 10/10]
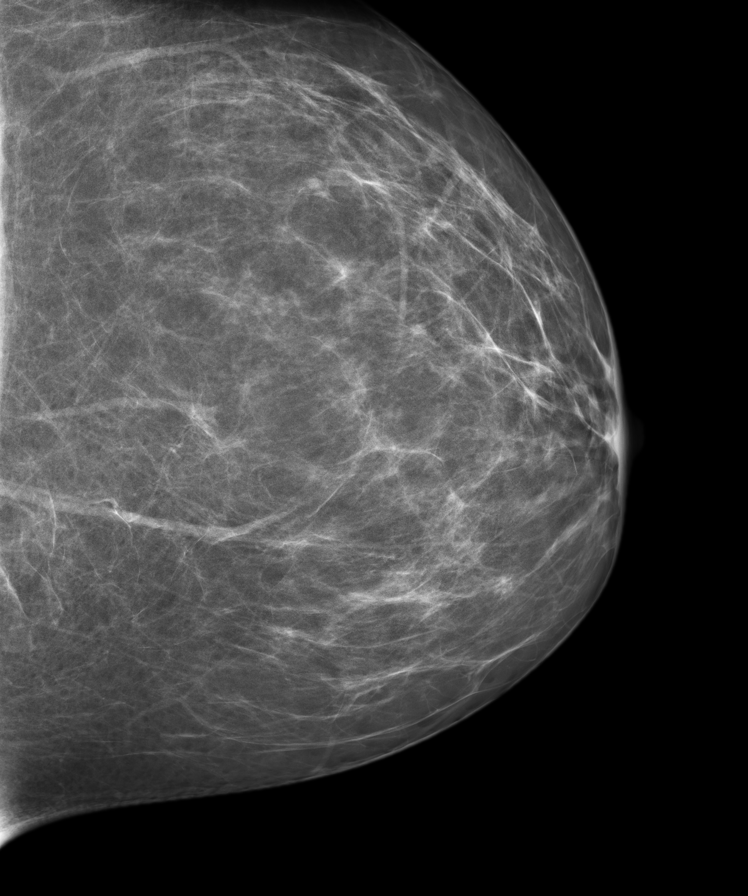

[8 of 8 positions shown; findings below may reference images not displayed]

ACR Breast Density Category b: There are scattered areas of
fibroglandular density.
FINDINGS: There are no findings suspicious for malignancy. The images were
evaluated with computer-aided detection.
IMPRESSION: No mammographic evidence of malignancy. A result letter of this
screening mammogram will be mailed directly to the patient.

RECOMMENDATION:
Screening mammogram in one year. (Code:WJ-I-BG6)

BI-RADS CATEGORY  1: Negative.

## 2020-08-09 ENCOUNTER — Other Ambulatory Visit (HOSPITAL_COMMUNITY)
Admission: RE | Admit: 2020-08-09 | Discharge: 2020-08-09 | Disposition: A | Discharge status: Home / Self Care | Payer: No Typology Code available for payment source | Source: Ambulatory Visit | Attending: Obstetrics and Gynecology | Admitting: Obstetrics and Gynecology

## 2020-08-09 ENCOUNTER — Encounter: Payer: Self-pay | Admitting: Obstetrics and Gynecology

## 2020-08-09 ENCOUNTER — Other Ambulatory Visit: Payer: Self-pay

## 2020-08-09 ENCOUNTER — Ambulatory Visit (INDEPENDENT_AMBULATORY_CARE_PROVIDER_SITE_OTHER): Payer: No Typology Code available for payment source | Admitting: Obstetrics and Gynecology

## 2020-08-09 VITALS — BP 130/90 | Ht 66.0 in | Wt 312.0 lb

## 2020-08-09 DIAGNOSIS — E039 Hypothyroidism, unspecified: Secondary | ICD-10-CM

## 2020-08-09 DIAGNOSIS — Z01419 Encounter for gynecological examination (general) (routine) without abnormal findings: Secondary | ICD-10-CM

## 2020-08-09 DIAGNOSIS — Z1231 Encounter for screening mammogram for malignant neoplasm of breast: Secondary | ICD-10-CM | POA: Diagnosis not present

## 2020-08-09 DIAGNOSIS — Z124 Encounter for screening for malignant neoplasm of cervix: Secondary | ICD-10-CM | POA: Diagnosis not present

## 2020-08-09 DIAGNOSIS — Z1151 Encounter for screening for human papillomavirus (HPV): Secondary | ICD-10-CM | POA: Diagnosis not present

## 2020-08-09 DIAGNOSIS — N920 Excessive and frequent menstruation with regular cycle: Secondary | ICD-10-CM

## 2020-08-09 NOTE — Progress Notes (Signed)
PCP:  Patient, No Pcp Per   Chief Complaint  Patient presents with  . Gynecologic Exam    Heavy cycles for the past 8 months, no abnormal pain     HPI:      Ms. Theresa Skinner is a 45 y.o. No obstetric history on file. whose LMP was Patient's last menstrual period was 08/02/2020 (exact date)., presents today for her NP> 3 yrs annual examination.  Her menses are regular every 28-30 days, lasting 6-7 days, heavy flow for 2 days, changing 2 products Q 45 min, for past 8 months. Heavy days used to be Q2-3 hrs.  Dysmenorrhea mild, occurring first 1-2 days of flow. She does not have intermenstrual bleeding. Hx of hypothyroidism, stopped meds about a yr ago on her own and hasn't had recent labs.   Sex activity: single partner, contraception - none. Conception difficult in past and needed fertility meds.  Last Pap: 11/11/17  Results were: no abnormalities /neg HPV DNA  Hx of STDs: none  Last mammogram: 06/22/16  Results were: normal--routine follow-up in 12 months There is no FH of breast cancer. There is no FH of ovarian cancer. The patient does do self-breast exams.  Tobacco use: The patient denies current or previous tobacco use. Alcohol use: social drinker No drug use.  Exercise: not active  She does not get adequate calcium and Vitamin D in her diet.   Past Medical History:  Diagnosis Date  . Pneumonia   . Sinus infection   . Upper respiratory infection     History reviewed. No pertinent surgical history.  Family History  Problem Relation Age of Onset  . Diabetes Mother   . Diabetes Father     Social History   Socioeconomic History  . Marital status: Married    Spouse name: Not on file  . Number of children: Not on file  . Years of education: Not on file  . Highest education level: Not on file  Occupational History  . Not on file  Tobacco Use  . Smoking status: Never Smoker  . Smokeless tobacco: Never Used  Vaping Use  . Vaping Use: Never used  Substance and  Sexual Activity  . Alcohol use: Yes  . Drug use: No  . Sexual activity: Yes    Birth control/protection: None  Other Topics Concern  . Not on file  Social History Narrative  . Not on file   Social Determinants of Health   Financial Resource Strain: Not on file  Food Insecurity: Not on file  Transportation Needs: Not on file  Physical Activity: Not on file  Stress: Not on file  Social Connections: Not on file  Intimate Partner Violence: Not on file    No current outpatient medications on file.     ROS:  Review of Systems  Constitutional: Negative for fatigue, fever and unexpected weight change.  Respiratory: Negative for cough, shortness of breath and wheezing.   Cardiovascular: Negative for chest pain, palpitations and leg swelling.  Gastrointestinal: Negative for blood in stool, constipation, diarrhea, nausea and vomiting.  Endocrine: Negative for cold intolerance, heat intolerance and polyuria.  Genitourinary: Positive for menstrual problem. Negative for dyspareunia, dysuria, flank pain, frequency, genital sores, hematuria, pelvic pain, urgency, vaginal bleeding, vaginal discharge and vaginal pain.  Musculoskeletal: Negative for back pain, joint swelling and myalgias.  Skin: Negative for rash.  Neurological: Negative for dizziness, syncope, [place]-headedness, numbness and headaches.  Hematological: Negative for adenopathy.  Psychiatric/Behavioral: Negative for agitation, confusion, sleep disturbance  and suicidal ideas. The patient is not nervous/anxious.   BREAST: No symptoms   Objective: BP 130/90   Ht 5\' 6"  (1.676 m)   Wt (!) 312 lb (141.5 kg)   LMP 08/02/2020 (Exact Date)   BMI 50.36 kg/m    Physical Exam Constitutional:      Appearance: She is well-developed.  Genitourinary:     Vulva normal.     Right Labia: No rash, tenderness or lesions.    Left Labia: No tenderness, lesions or rash.    No vaginal discharge, erythema or tenderness.      Right  Adnexa: not tender and no mass present.    Left Adnexa: not tender and no mass present.    No cervical friability or polyp.     Uterus is not enlarged or tender.  Breasts:     Right: No mass, nipple discharge, skin change or tenderness.     Left: No mass, nipple discharge, skin change or tenderness.    Neck:     Thyroid: No thyromegaly.  Cardiovascular:     Rate and Rhythm: Normal rate and regular rhythm.     Heart sounds: Normal heart sounds. No murmur heard.   Pulmonary:     Effort: Pulmonary effort is normal.     Breath sounds: Normal breath sounds.  Abdominal:     Palpations: Abdomen is soft.     Tenderness: There is no abdominal tenderness. There is no guarding or rebound.  Musculoskeletal:        General: Normal range of motion.     Cervical back: Normal range of motion.  Lymphadenopathy:     Cervical: No cervical adenopathy.  Neurological:     General: No focal deficit present.     Mental Status: She is alert and oriented to person, place, and time.     Cranial Nerves: No cranial nerve deficit.  Skin:    General: Skin is warm and dry.  Psychiatric:        Mood and Affect: Mood normal.        Behavior: Behavior normal.        Thought Content: Thought content normal.        Judgment: Judgment normal.  Vitals reviewed.     Assessment/Plan: Encounter for annual routine gynecological examination  Cervical cancer screening - Plan: Cytology - PAP  Screening for HPV (human papillomavirus) - Plan: Cytology - PAP; check pap due to menorrhagia  Encounter for screening mammogram for malignant neoplasm of breast - Plan: MM 3D SCREEN BREAST BILATERAL; pt to sched mammo  Menorrhagia with regular cycle - Plan: CBC with Differential/Platelet, TSH + free T4; check labs. If abn, will treat thyroid and see if sx improve. If normal, will check GYN u/s. Will f/u with results.   Acquired hypothyroidism - Plan: TSH + free T4   GYN counsel breast self exam, mammography  screening, adequate intake of calcium and vitamin D, diet and exercise     F/U  Return in about 1 year (around 08/09/2021).  Coe Angelos B. Marvalene Barrett, PA-C 08/09/2020 2:31 PM

## 2020-08-09 NOTE — Patient Instructions (Addendum)
I value your feedback and you entrusting us with your care. If you get a [facility] patient survey, I would appreciate you taking the time to let us know about your experience today. Thank you!  [facility]: [phone]      

## 2020-08-10 ENCOUNTER — Other Ambulatory Visit: Payer: Self-pay | Admitting: Obstetrics and Gynecology

## 2020-08-10 LAB — CBC WITH DIFFERENTIAL/PLATELET
Basophils Absolute: 0.1 10*3/uL (ref 0.0–0.2)
Basos: 1 %
EOS (ABSOLUTE): 0.4 10*3/uL (ref 0.0–0.4)
Eos: 5 %
Hematocrit: 40.9 % (ref 34.0–46.6)
Hemoglobin: 13.1 g/dL (ref 11.1–15.9)
Immature Grans (Abs): 0 10*3/uL (ref 0.0–0.1)
Immature Granulocytes: 0 %
Lymphocytes Absolute: 3 10*3/uL (ref 0.7–3.1)
Lymphs: 33 %
MCH: 27.6 pg (ref 26.6–33.0)
MCHC: 32 g/dL (ref 31.5–35.7)
MCV: 86 fL (ref 79–97)
Monocytes Absolute: 0.7 10*3/uL (ref 0.1–0.9)
Monocytes: 7 %
Neutrophils Absolute: 4.9 10*3/uL (ref 1.4–7.0)
Neutrophils: 54 %
Platelets: 387 10*3/uL (ref 150–450)
RBC: 4.74 x10E6/uL (ref 3.77–5.28)
RDW: 12.7 % (ref 11.7–15.4)
WBC: 9.1 10*3/uL (ref 3.4–10.8)

## 2020-08-10 LAB — TSH+FREE T4
Free T4: 1.01 ng/dL (ref 0.82–1.77)
TSH: 4.84 u[IU]/mL — ABNORMAL HIGH (ref 0.450–4.500)

## 2020-08-10 MED ORDER — LEVOTHYROXINE SODIUM 75 MCG PO TABS: 75.0000 ug | ORAL_TABLET | Freq: Every day | ORAL | 0 refills | Status: DC

## 2020-08-10 NOTE — Progress Notes (Signed)
Rx levo 75 mcg for hypothyroidism. Pt had stopped meds for at least a year. Restart and recheck labs in 4 wks.

## 2020-08-10 NOTE — Addendum Note (Signed)
Addended by: Althea Grimmer B on: 08/10/2020 05:37 PM   Modules accepted: Orders

## 2020-08-13 LAB — CYTOLOGY - PAP
Comment: NEGATIVE
Diagnosis: NEGATIVE
High risk HPV: NEGATIVE

## 2020-09-03 ENCOUNTER — Other Ambulatory Visit: Payer: Self-pay

## 2020-09-06 ENCOUNTER — Other Ambulatory Visit: Payer: Self-pay

## 2020-09-06 ENCOUNTER — Ambulatory Visit
Admission: RE | Admit: 2020-09-06 | Discharge: 2020-09-06 | Disposition: A | Discharge status: Home / Self Care | Payer: No Typology Code available for payment source | Source: Ambulatory Visit | Attending: Obstetrics and Gynecology | Admitting: Obstetrics and Gynecology

## 2020-09-06 DIAGNOSIS — Z1231 Encounter for screening mammogram for malignant neoplasm of breast: Secondary | ICD-10-CM | POA: Insufficient documentation

## 2020-09-09 ENCOUNTER — Inpatient Hospital Stay
Admission: RE | Admit: 2020-09-09 | Discharge: 2020-09-09 | Disposition: A | Discharge status: Home / Self Care | Payer: Self-pay | Source: Ambulatory Visit | Attending: *Deleted | Admitting: *Deleted

## 2020-09-09 ENCOUNTER — Other Ambulatory Visit: Payer: Self-pay | Admitting: *Deleted

## 2020-09-09 DIAGNOSIS — Z1231 Encounter for screening mammogram for malignant neoplasm of breast: Secondary | ICD-10-CM

## 2020-09-19 ENCOUNTER — Other Ambulatory Visit: Payer: Self-pay

## 2020-09-19 MED ORDER — AZELASTINE HCL 0.1 % NA SOLN
NASAL | 0 refills | Status: DC
  Filled 2020-09-19: qty 30, 30d supply, fill #0

## 2020-09-19 MED ORDER — FEXOFENADINE HCL 180 MG PO TABS
180.0000 mg | ORAL_TABLET | Freq: Every day | ORAL | 0 refills | Status: DC
  Filled 2020-09-19: qty 90, 90d supply, fill #0

## 2020-09-20 ENCOUNTER — Other Ambulatory Visit: Payer: Self-pay

## 2020-09-20 MED ORDER — BENZONATATE 100 MG PO CAPS
ORAL_CAPSULE | ORAL | 0 refills | Status: DC
  Filled 2020-09-20: qty 9, 3d supply, fill #0

## 2020-09-20 MED ORDER — AMOXICILLIN-POT CLAVULANATE 875-125 MG PO TABS
ORAL_TABLET | ORAL | 0 refills | Status: DC
  Filled 2020-09-20: qty 20, 10d supply, fill #0

## 2020-09-23 ENCOUNTER — Other Ambulatory Visit: Payer: Self-pay

## 2020-09-23 MED ORDER — PREDNISONE 20 MG PO TABS
ORAL_TABLET | ORAL | 0 refills | Status: DC
  Filled 2020-09-23: qty 10, 5d supply, fill #0

## 2020-09-23 MED ORDER — HYDROCOD POLST-CPM POLST ER 10-8 MG/5ML PO SUER
ORAL | 0 refills | Status: DC
  Filled 2020-09-23: qty 120, 24d supply, fill #0

## 2020-09-23 MED ORDER — PROMETHAZINE-DM 6.25-15 MG/5ML PO SYRP
ORAL_SOLUTION | ORAL | 0 refills | Status: DC
  Filled 2020-09-23: qty 120, 6d supply, fill #0

## 2020-09-23 MED ORDER — LEVOFLOXACIN 500 MG PO TABS
ORAL_TABLET | ORAL | 0 refills | Status: DC
  Filled 2020-09-23: qty 5, 5d supply, fill #0

## 2020-09-23 MED ORDER — FLUCONAZOLE 150 MG PO TABS
ORAL_TABLET | ORAL | 0 refills | Status: DC
  Filled 2020-09-23: qty 3, 9d supply, fill #0

## 2021-07-01 ENCOUNTER — Other Ambulatory Visit: Payer: Self-pay

## 2021-07-01 MED ORDER — PREDNISONE 10 MG PO TABS
ORAL_TABLET | ORAL | 0 refills | Status: DC
  Filled 2021-07-01: qty 21, 6d supply, fill #0

## 2021-07-01 MED ORDER — AMOXICILLIN 875 MG PO TABS
ORAL_TABLET | ORAL | 0 refills | Status: DC
  Filled 2021-07-01: qty 20, 10d supply, fill #0

## 2021-08-28 ENCOUNTER — Other Ambulatory Visit: Payer: Self-pay

## 2021-08-29 ENCOUNTER — Other Ambulatory Visit: Payer: Self-pay

## 2021-11-20 ENCOUNTER — Other Ambulatory Visit: Payer: Self-pay

## 2021-12-08 ENCOUNTER — Other Ambulatory Visit: Payer: Self-pay

## 2021-12-18 ENCOUNTER — Other Ambulatory Visit: Payer: Self-pay | Admitting: Obstetrics and Gynecology

## 2021-12-18 DIAGNOSIS — Z1231 Encounter for screening mammogram for malignant neoplasm of breast: Secondary | ICD-10-CM

## 2022-01-09 ENCOUNTER — Ambulatory Visit
Admission: RE | Admit: 2022-01-09 | Discharge: 2022-01-09 | Disposition: A | Discharge status: Home / Self Care | Payer: No Typology Code available for payment source | Source: Ambulatory Visit | Attending: Obstetrics and Gynecology | Admitting: Obstetrics and Gynecology

## 2022-01-09 ENCOUNTER — Other Ambulatory Visit: Payer: Self-pay

## 2022-01-09 DIAGNOSIS — Z1231 Encounter for screening mammogram for malignant neoplasm of breast: Secondary | ICD-10-CM | POA: Diagnosis present

## 2022-01-09 MED ORDER — METHYLPREDNISOLONE 4 MG PO TBPK
ORAL_TABLET | ORAL | 0 refills | Status: DC
  Filled 2022-01-09: qty 21, 6d supply, fill #0

## 2022-01-09 MED ORDER — LEVOFLOXACIN 500 MG PO TABS
ORAL_TABLET | ORAL | 0 refills | Status: DC
  Filled 2022-01-09: qty 10, 10d supply, fill #0

## 2022-01-09 MED ORDER — HYDROCODONE BIT-HOMATROP MBR 5-1.5 MG/5ML PO SOLN
ORAL | 0 refills | Status: DC
  Filled 2022-01-09: qty 60, 3d supply, fill #0

## 2022-01-09 MED ORDER — ALBUTEROL SULFATE HFA 108 (90 BASE) MCG/ACT IN AERS
INHALATION_SPRAY | RESPIRATORY_TRACT | 0 refills | Status: AC
  Filled 2022-01-09: qty 18, 25d supply, fill #0

## 2022-01-13 ENCOUNTER — Other Ambulatory Visit: Payer: Self-pay

## 2022-01-13 MED ORDER — MONTELUKAST SODIUM 10 MG PO TABS
10.0000 mg | ORAL_TABLET | Freq: Every day | ORAL | 11 refills | Status: DC
  Filled 2022-01-13: qty 30, 30d supply, fill #0
  Filled 2022-02-18: qty 30, 30d supply, fill #1
  Filled 2022-05-28 – 2022-05-29 (×2): qty 30, 30d supply, fill #2
  Filled 2022-11-02: qty 30, 30d supply, fill #3
  Filled 2022-12-24: qty 30, 30d supply, fill #4

## 2022-01-13 MED ORDER — PREDNISONE 20 MG PO TABS
ORAL_TABLET | ORAL | 0 refills | Status: DC
  Filled 2022-01-13: qty 12, 8d supply, fill #0

## 2022-01-13 MED ORDER — FLUCONAZOLE 150 MG PO TABS
ORAL_TABLET | ORAL | 1 refills | Status: DC
  Filled 2022-01-13: qty 1, 1d supply, fill #0
  Filled 2022-01-16: qty 1, 1d supply, fill #1

## 2022-01-13 MED ORDER — TRIAMCINOLONE ACETONIDE 55 MCG/ACT NA AERO
INHALATION_SPRAY | NASAL | 12 refills | Status: AC
  Filled 2022-02-18: qty 10.8, 30d supply, fill #0

## 2022-01-18 ENCOUNTER — Other Ambulatory Visit: Payer: Self-pay

## 2022-01-19 ENCOUNTER — Other Ambulatory Visit: Payer: Self-pay

## 2022-01-20 ENCOUNTER — Other Ambulatory Visit: Payer: Self-pay

## 2022-01-20 MED ORDER — LEVOTHYROXINE SODIUM 25 MCG PO TABS
ORAL_TABLET | ORAL | 1 refills | Status: DC
  Filled 2022-01-20: qty 30, 30d supply, fill #0
  Filled 2022-02-19: qty 30, 30d supply, fill #1

## 2022-01-20 MED ORDER — HYDROCOD POLI-CHLORPHE POLI ER 10-8 MG/5ML PO SUER
ORAL | 0 refills | Status: DC
Start: 2022-01-20 — End: 2022-02-03
  Filled 2022-01-20: qty 115, 12d supply, fill #0

## 2022-01-28 ENCOUNTER — Other Ambulatory Visit: Payer: Self-pay

## 2022-02-01 NOTE — Progress Notes (Unsigned)
PCP:  Patient, No Pcp Per   No chief complaint on file.    HPI:      Ms. DENASIA VENN is a 46 y.o. No obstetric history on file. whose LMP was Patient's last menstrual period was 12/12/2021., presents today for her annual examination.  Her menses are regular every 28-30 days, lasting 6-7 days, heavy flow for 2 days, changing 2 products Q 45 min, for past 8 months. Heavy days used to be Q2-3 hrs.  Dysmenorrhea mild, occurring first 1-2 days of flow. She does not have intermenstrual bleeding. Hx of hypothyroidism, stopped meds about a yr ago on her own and hasn't had recent labs.   Sex activity: single partner, contraception - none. Conception difficult in past and needed fertility meds.  Last Pap: 08/09/20  Results were: no abnormalities /neg HPV DNA  Hx of STDs: none  Last mammogram: 01/09/22  Results were: normal--routine follow-up in 12 months There is no FH of breast cancer. There is no FH of ovarian cancer. The patient does do self-breast exams.  Tobacco use: The patient denies current or previous tobacco use. Alcohol use: social drinker No drug use.  Exercise: not active  Colonoscopy: never  She does not get adequate calcium and Vitamin D in her diet.   Past Medical History:  Diagnosis Date   Pneumonia    Sinus infection    Upper respiratory infection     No past surgical history on file.  Family History  Problem Relation Age of Onset   Diabetes Mother    Diabetes Father    Breast cancer Neg Hx     Social History   Socioeconomic History   Marital status: Married    Spouse name: Not on file   Number of children: Not on file   Years of education: Not on file   Highest education level: Not on file  Occupational History   Not on file  Tobacco Use   Smoking status: Never   Smokeless tobacco: Never  Vaping Use   Vaping Use: Never used  Substance and Sexual Activity   Alcohol use: Yes   Drug use: No   Sexual activity: Yes    Birth control/protection:  None  Other Topics Concern   Not on file  Social History Narrative   Not on file   Social Determinants of Health   Financial Resource Strain: Not on file  Food Insecurity: Not on file  Transportation Needs: Not on file  Physical Activity: Not on file  Stress: Not on file  Social Connections: Not on file  Intimate Partner Violence: Not on file     Current Outpatient Medications:    albuterol (VENTOLIN HFA) 108 (90 Base) MCG/ACT inhaler, 2 puffs q.i.d. p.r.n. short of breath, wheezing, or cough, Disp: 18 g, Rfl: 0   amoxicillin (AMOXIL) 875 MG tablet, Take 1 tablet (875 mg total) by mouth every 12 (twelve) hours for 10 days, Disp: 20 tablet, Rfl: 0   amoxicillin-clavulanate (AUGMENTIN) 875-125 MG tablet, take 1 tablet by mouth 2 times a day, Disp: 20 tablet, Rfl: 0   azelastine (ASTELIN) 0.1 % nasal spray, Administer 1 spray into each nostril twice daily, Disp: 30 mL, Rfl: 0   benzonatate (TESSALON) 100 MG capsule, 1 cap by mouth 3 times per day as needed for cough, Disp: 9 capsule, Rfl: 0   chlorpheniramine-HYDROcodone (TUSSIONEX) 10-8 MG/5ML SUER, Take 5 mLs by mouth nightly as needed for Cough, Disp: 120 mL, Rfl: 0   chlorpheniramine-HYDROcodone (  TUSSIONEX) 10-8 MG/5ML, Take 5 mLs by mouth every 12 (twelve) hours as needed for Cough, Disp: 115 mL, Rfl: 0   fexofenadine (ALLEGRA) 180 MG tablet, Take 1 tablet (180 mg total) by mouth daily., Disp: 90 tablet, Rfl: 0   fluconazole (DIFLUCAN) 150 MG tablet, Take 1 tablet (150 mg total) by mouth once for 1 dose May repeat once if symptoms recur after 3 days, Disp: 3 tablet, Rfl: 0   fluconazole (DIFLUCAN) 150 MG tablet, Take 1 tablet (150 mg total) by mouth once for 1 dose May repeat x1 if symptoms recur.  Refill on prescription., Disp: 1 tablet, Rfl: 1   HYDROcodone bit-homatropine (HYCODAN) 5-1.5 MG/5ML syrup, Take 5 mLs by mouth every 6 (six) hours as needed for Cough for up to 10 days, Disp: 60 mL, Rfl: 0   levofloxacin (LEVAQUIN) 500 MG  tablet, Take 1 tablet (500 mg total) by mouth once daily for 5 days, Disp: 5 tablet, Rfl: 0   levofloxacin (LEVAQUIN) 500 MG tablet, Take 1 tablet (500 mg total) by mouth once daily for 10 days, Disp: 10 tablet, Rfl: 0   levothyroxine (SYNTHROID) 25 MCG tablet, Take 1 tablet (25 mcg total) by mouth once daily Take on an empty stomach with a glass of water at least 30-60 minutes before breakfast., Disp: 30 tablet, Rfl: 1   levothyroxine (SYNTHROID) 75 MCG tablet, Take 1 tablet (75 mcg total) by mouth daily., Disp: 90 tablet, Rfl: 0   methylPREDNISolone (MEDROL DOSEPAK) 4 MG TBPK tablet, Follow package directions., Disp: 21 tablet, Rfl: 0   montelukast (SINGULAIR) 10 MG tablet, Take 1 tablet (10 mg total) by mouth at bedtime, Disp: 30 tablet, Rfl: 11   predniSONE (DELTASONE) 10 MG tablet, Take 6 tabs on day 1, 5 tabs on day 2, 4 tabs on day 3, 3 tabs on day 4, 2 tabs on day 5, 1 tab on day 6. Then stop., Disp: 21 tablet, Rfl: 0   predniSONE (DELTASONE) 20 MG tablet, Take 2 tablets (40 mg total) by mouth once daily for 5 days, Disp: 10 tablet, Rfl: 0   predniSONE (DELTASONE) 20 MG tablet, 2,2,2,2,1,1,1,1, Disp: 12 tablet, Rfl: 0   promethazine-dextromethorphan (PROMETHAZINE-DM) 6.25-15 MG/5ML syrup, Take 5 mLs by mouth every 6 (six) hours as needed for Cough, Disp: 120 mL, Rfl: 0   triamcinolone (NASACORT) 55 MCG/ACT AERO nasal inhaler, Place 2 sprays into both nostrils once daily, Disp: 10.8 mL, Rfl: 12     ROS:  Review of Systems  Constitutional:  Negative for fatigue, fever and unexpected weight change.  Respiratory:  Negative for cough, shortness of breath and wheezing.   Cardiovascular:  Negative for chest pain, palpitations and leg swelling.  Gastrointestinal:  Negative for blood in stool, constipation, diarrhea, nausea and vomiting.  Endocrine: Negative for cold intolerance, heat intolerance and polyuria.  Genitourinary:  Positive for menstrual problem. Negative for dyspareunia, dysuria,  flank pain, frequency, genital sores, hematuria, pelvic pain, urgency, vaginal bleeding, vaginal discharge and vaginal pain.  Musculoskeletal:  Negative for back pain, joint swelling and myalgias.  Skin:  Negative for rash.  Neurological:  Negative for dizziness, syncope, Jewell-headedness, numbness and headaches.  Hematological:  Negative for adenopathy.  Psychiatric/Behavioral:  Negative for agitation, confusion, sleep disturbance and suicidal ideas. The patient is not nervous/anxious.   BREAST: No symptoms   Objective: LMP 12/12/2021    Physical Exam Constitutional:      Appearance: She is well-developed.  Genitourinary:     Vulva normal.  Right Labia: No rash, tenderness or lesions.    Left Labia: No tenderness, lesions or rash.    No vaginal discharge, erythema or tenderness.      Right Adnexa: not tender and no mass present.    Left Adnexa: not tender and no mass present.    No cervical friability or polyp.     Uterus is not enlarged or tender.  Breasts:    Right: No mass, nipple discharge, skin change or tenderness.     Left: No mass, nipple discharge, skin change or tenderness.  Neck:     Thyroid: No thyromegaly.  Cardiovascular:     Rate and Rhythm: Normal rate and regular rhythm.     Heart sounds: Normal heart sounds. No murmur heard. Pulmonary:     Effort: Pulmonary effort is normal.     Breath sounds: Normal breath sounds.  Abdominal:     Palpations: Abdomen is soft.     Tenderness: There is no abdominal tenderness. There is no guarding or rebound.  Musculoskeletal:        General: Normal range of motion.     Cervical back: Normal range of motion.  Lymphadenopathy:     Cervical: No cervical adenopathy.  Neurological:     General: No focal deficit present.     Mental Status: She is alert and oriented to person, place, and time.     Cranial Nerves: No cranial nerve deficit.  Skin:    General: Skin is warm and dry.  Psychiatric:        Mood and Affect:  Mood normal.        Behavior: Behavior normal.        Thought Content: Thought content normal.        Judgment: Judgment normal.  Vitals reviewed.     Assessment/Plan: Encounter for annual routine gynecological examination  Cervical cancer screening - Plan: Cytology - PAP  Screening for HPV (human papillomavirus) - Plan: Cytology - PAP; check pap due to menorrhagia  Encounter for screening mammogram for malignant neoplasm of breast - Plan: MM 3D SCREEN BREAST BILATERAL; pt to sched mammo  Menorrhagia with regular cycle - Plan: CBC with Differential/Platelet, TSH + free T4; check labs. If abn, will treat thyroid and see if sx improve. If normal, will check GYN u/s. Will f/u with results.   Acquired hypothyroidism - Plan: TSH + free T4   GYN counsel breast self exam, mammography screening, adequate intake of calcium and vitamin D, diet and exercise     F/U  No follow-ups on file.  Lucynda Rosano B. Julanne Schlueter, PA-C 02/01/2022 10:19 AM

## 2022-02-03 ENCOUNTER — Ambulatory Visit (INDEPENDENT_AMBULATORY_CARE_PROVIDER_SITE_OTHER): Payer: No Typology Code available for payment source | Admitting: Obstetrics and Gynecology

## 2022-02-03 ENCOUNTER — Encounter: Payer: Self-pay | Admitting: Obstetrics and Gynecology

## 2022-02-03 VITALS — BP 140/90 | Ht 66.0 in | Wt 297.0 lb

## 2022-02-03 DIAGNOSIS — Z1211 Encounter for screening for malignant neoplasm of colon: Secondary | ICD-10-CM

## 2022-02-03 DIAGNOSIS — Z01419 Encounter for gynecological examination (general) (routine) without abnormal findings: Secondary | ICD-10-CM

## 2022-02-03 DIAGNOSIS — Z1231 Encounter for screening mammogram for malignant neoplasm of breast: Secondary | ICD-10-CM

## 2022-02-03 NOTE — Patient Instructions (Signed)
I value your feedback and you entrusting us with your care. If you get a [facility] patient survey, I would appreciate you taking the time to let us know about your experience today. Thank you! ? ? ?

## 2022-02-17 ENCOUNTER — Other Ambulatory Visit: Payer: Self-pay

## 2022-02-17 MED ORDER — EPINEPHRINE 0.3 MG/0.3ML IJ SOAJ
INTRAMUSCULAR | 0 refills | Status: AC
  Filled 2022-02-17: qty 2, 30d supply, fill #0

## 2022-02-18 ENCOUNTER — Other Ambulatory Visit: Payer: Self-pay | Admitting: Obstetrics and Gynecology

## 2022-02-18 ENCOUNTER — Other Ambulatory Visit: Payer: Self-pay

## 2022-02-18 DIAGNOSIS — E039 Hypothyroidism, unspecified: Secondary | ICD-10-CM

## 2022-02-19 ENCOUNTER — Other Ambulatory Visit: Payer: Self-pay

## 2022-02-24 ENCOUNTER — Other Ambulatory Visit: Payer: Self-pay

## 2022-02-26 ENCOUNTER — Other Ambulatory Visit: Payer: Self-pay

## 2022-03-10 ENCOUNTER — Other Ambulatory Visit: Payer: Self-pay

## 2022-03-10 MED ORDER — METFORMIN HCL ER 500 MG PO TB24
ORAL_TABLET | ORAL | 3 refills | Status: AC
  Filled 2022-03-10: qty 30, 30d supply, fill #0
  Filled 2022-05-26: qty 30, 30d supply, fill #1

## 2022-03-20 ENCOUNTER — Other Ambulatory Visit: Payer: Self-pay

## 2022-05-01 ENCOUNTER — Other Ambulatory Visit: Payer: Self-pay

## 2022-05-01 MED ORDER — AMOXICILLIN-POT CLAVULANATE 875-125 MG PO TABS
ORAL_TABLET | ORAL | 0 refills | Status: DC
  Filled 2022-05-01: qty 14, 7d supply, fill #0

## 2022-05-01 MED ORDER — PREDNISONE 10 MG PO TABS
ORAL_TABLET | ORAL | 0 refills | Status: DC
  Filled 2022-05-01: qty 20, 8d supply, fill #0

## 2022-05-01 MED ORDER — HYDROCOD POLI-CHLORPHE POLI ER 10-8 MG/5ML PO SUER
5.0000 mL | Freq: Two times a day (BID) | ORAL | 0 refills | Status: DC | PRN
  Filled 2022-05-01: qty 115, 12d supply, fill #0

## 2022-05-12 ENCOUNTER — Other Ambulatory Visit: Payer: Self-pay

## 2022-05-12 MED ORDER — PREDNISONE 10 MG PO TABS
ORAL_TABLET | ORAL | 0 refills | Status: AC
  Filled 2022-05-12: qty 30, 12d supply, fill #0

## 2022-05-12 MED ORDER — LEVOFLOXACIN 500 MG PO TABS
500.0000 mg | ORAL_TABLET | Freq: Every day | ORAL | 0 refills | Status: DC
  Filled 2022-05-12: qty 7, 7d supply, fill #0

## 2022-05-12 MED ORDER — HYDROCOD POLI-CHLORPHE POLI ER 10-8 MG/5ML PO SUER
5.0000 mL | Freq: Two times a day (BID) | ORAL | 0 refills | Status: DC | PRN
  Filled 2022-05-12: qty 70, 7d supply, fill #0

## 2022-05-14 ENCOUNTER — Other Ambulatory Visit: Payer: Self-pay

## 2022-05-15 ENCOUNTER — Other Ambulatory Visit: Payer: Self-pay

## 2022-05-17 ENCOUNTER — Other Ambulatory Visit: Payer: Self-pay

## 2022-05-18 ENCOUNTER — Other Ambulatory Visit: Payer: Self-pay

## 2022-05-18 MED ORDER — LEVOTHYROXINE SODIUM 25 MCG PO TABS
25.0000 ug | ORAL_TABLET | Freq: Every day | ORAL | 1 refills | Status: DC
  Filled 2022-05-18: qty 30, 30d supply, fill #0
  Filled 2022-06-21: qty 30, 30d supply, fill #1

## 2022-05-26 ENCOUNTER — Other Ambulatory Visit: Payer: Self-pay

## 2022-05-27 ENCOUNTER — Other Ambulatory Visit: Payer: Self-pay

## 2022-05-28 ENCOUNTER — Other Ambulatory Visit: Payer: Self-pay

## 2022-05-29 ENCOUNTER — Other Ambulatory Visit: Payer: Self-pay

## 2022-06-04 DIAGNOSIS — J309 Allergic rhinitis, unspecified: Secondary | ICD-10-CM | POA: Diagnosis not present

## 2022-06-04 DIAGNOSIS — J301 Allergic rhinitis due to pollen: Secondary | ICD-10-CM | POA: Diagnosis not present

## 2022-06-04 DIAGNOSIS — E119 Type 2 diabetes mellitus without complications: Secondary | ICD-10-CM | POA: Diagnosis not present

## 2022-06-04 DIAGNOSIS — E039 Hypothyroidism, unspecified: Secondary | ICD-10-CM | POA: Diagnosis not present

## 2022-06-05 ENCOUNTER — Other Ambulatory Visit: Payer: Self-pay

## 2022-06-05 MED ORDER — METFORMIN HCL ER 500 MG PO TB24
500.0000 mg | ORAL_TABLET | Freq: Two times a day (BID) | ORAL | 3 refills | Status: DC
  Filled 2022-06-05 – 2022-06-21 (×2): qty 60, 30d supply, fill #0
  Filled 2022-07-23: qty 60, 30d supply, fill #1
  Filled 2022-09-16: qty 60, 30d supply, fill #2
  Filled 2022-10-14: qty 60, 30d supply, fill #3

## 2022-06-05 MED ORDER — JARDIANCE 25 MG PO TABS
25.0000 mg | ORAL_TABLET | Freq: Every day | ORAL | 3 refills | Status: DC
  Filled 2022-06-05 – 2022-06-09 (×2): qty 30, 30d supply, fill #0
  Filled 2022-11-02: qty 30, 30d supply, fill #1

## 2022-06-09 ENCOUNTER — Other Ambulatory Visit: Payer: Self-pay

## 2022-06-11 DIAGNOSIS — J301 Allergic rhinitis due to pollen: Secondary | ICD-10-CM | POA: Diagnosis not present

## 2022-06-17 ENCOUNTER — Other Ambulatory Visit: Payer: Self-pay

## 2022-06-17 DIAGNOSIS — E559 Vitamin D deficiency, unspecified: Secondary | ICD-10-CM | POA: Diagnosis not present

## 2022-06-17 DIAGNOSIS — E039 Hypothyroidism, unspecified: Secondary | ICD-10-CM | POA: Diagnosis not present

## 2022-06-17 DIAGNOSIS — J309 Allergic rhinitis, unspecified: Secondary | ICD-10-CM | POA: Diagnosis not present

## 2022-06-17 DIAGNOSIS — E119 Type 2 diabetes mellitus without complications: Secondary | ICD-10-CM | POA: Diagnosis not present

## 2022-06-17 MED ORDER — MOUNJARO 2.5 MG/0.5ML ~~LOC~~ SOAJ
2.5000 mg | SUBCUTANEOUS | 3 refills | Status: AC
  Filled 2022-06-17 – 2022-06-22 (×3): qty 2, 28d supply, fill #0
  Filled 2023-01-13: qty 2, 28d supply, fill #1
  Filled 2023-04-05: qty 2, 28d supply, fill #2

## 2022-06-21 ENCOUNTER — Other Ambulatory Visit: Payer: Self-pay

## 2022-06-22 ENCOUNTER — Other Ambulatory Visit: Payer: Self-pay

## 2022-06-24 ENCOUNTER — Other Ambulatory Visit: Payer: Self-pay

## 2022-06-25 ENCOUNTER — Other Ambulatory Visit: Payer: Self-pay

## 2022-06-25 DIAGNOSIS — J301 Allergic rhinitis due to pollen: Secondary | ICD-10-CM | POA: Diagnosis not present

## 2022-06-26 DIAGNOSIS — J301 Allergic rhinitis due to pollen: Secondary | ICD-10-CM | POA: Diagnosis not present

## 2022-07-02 DIAGNOSIS — J301 Allergic rhinitis due to pollen: Secondary | ICD-10-CM | POA: Diagnosis not present

## 2022-07-09 DIAGNOSIS — J301 Allergic rhinitis due to pollen: Secondary | ICD-10-CM | POA: Diagnosis not present

## 2022-07-14 ENCOUNTER — Other Ambulatory Visit: Payer: Self-pay

## 2022-07-14 DIAGNOSIS — R6889 Other general symptoms and signs: Secondary | ICD-10-CM | POA: Diagnosis not present

## 2022-07-14 DIAGNOSIS — Z23 Encounter for immunization: Secondary | ICD-10-CM | POA: Diagnosis not present

## 2022-07-14 DIAGNOSIS — J209 Acute bronchitis, unspecified: Secondary | ICD-10-CM | POA: Diagnosis not present

## 2022-07-14 MED ORDER — HYDROCOD POLI-CHLORPHE POLI ER 10-8 MG/5ML PO SUER
5.0000 mL | Freq: Every evening | ORAL | 0 refills | Status: AC
  Filled 2022-07-14: qty 70, 14d supply, fill #0

## 2022-07-19 ENCOUNTER — Other Ambulatory Visit: Payer: Self-pay

## 2022-07-19 MED ORDER — MOUNJARO 2.5 MG/0.5ML ~~LOC~~ SOAJ
2.5000 mg | SUBCUTANEOUS | 3 refills | Status: AC
  Filled 2022-07-19: qty 2, 28d supply, fill #0
  Filled 2022-08-19: qty 2, 28d supply, fill #1
  Filled 2022-11-02 – 2022-11-03 (×4): qty 2, 28d supply, fill #2
  Filled 2023-02-10: qty 2, 28d supply, fill #3

## 2022-07-20 ENCOUNTER — Other Ambulatory Visit: Payer: Self-pay

## 2022-07-21 ENCOUNTER — Other Ambulatory Visit: Payer: Self-pay

## 2022-07-23 ENCOUNTER — Other Ambulatory Visit: Payer: Self-pay

## 2022-07-23 DIAGNOSIS — J301 Allergic rhinitis due to pollen: Secondary | ICD-10-CM | POA: Diagnosis not present

## 2022-07-24 ENCOUNTER — Other Ambulatory Visit: Payer: Self-pay

## 2022-07-24 DIAGNOSIS — Z8709 Personal history of other diseases of the respiratory system: Secondary | ICD-10-CM | POA: Diagnosis not present

## 2022-07-24 DIAGNOSIS — R059 Cough, unspecified: Secondary | ICD-10-CM | POA: Diagnosis not present

## 2022-07-24 DIAGNOSIS — R509 Fever, unspecified: Secondary | ICD-10-CM | POA: Diagnosis not present

## 2022-07-24 DIAGNOSIS — Z03818 Encounter for observation for suspected exposure to other biological agents ruled out: Secondary | ICD-10-CM | POA: Diagnosis not present

## 2022-07-24 DIAGNOSIS — J9811 Atelectasis: Secondary | ICD-10-CM | POA: Diagnosis not present

## 2022-07-24 DIAGNOSIS — J209 Acute bronchitis, unspecified: Secondary | ICD-10-CM | POA: Diagnosis not present

## 2022-07-24 MED ORDER — PROMETHAZINE-DM 6.25-15 MG/5ML PO SYRP
5.0000 mL | ORAL_SOLUTION | Freq: Four times a day (QID) | ORAL | 0 refills | Status: DC
  Filled 2022-07-24: qty 118, 6d supply, fill #0

## 2022-07-24 MED ORDER — ALBUTEROL SULFATE HFA 108 (90 BASE) MCG/ACT IN AERS
2.0000 | INHALATION_SPRAY | Freq: Four times a day (QID) | RESPIRATORY_TRACT | 0 refills | Status: AC
  Filled 2022-07-24: qty 6.7, 25d supply, fill #0

## 2022-07-24 MED ORDER — LEVOFLOXACIN 500 MG PO TABS
500.0000 mg | ORAL_TABLET | Freq: Every day | ORAL | 0 refills | Status: DC
  Filled 2022-07-24: qty 7, 7d supply, fill #0

## 2022-07-24 MED ORDER — PREDNISONE 20 MG PO TABS
20.0000 mg | ORAL_TABLET | Freq: Every day | ORAL | 0 refills | Status: DC
  Filled 2022-07-24: qty 5, 5d supply, fill #0

## 2022-07-30 DIAGNOSIS — J301 Allergic rhinitis due to pollen: Secondary | ICD-10-CM | POA: Diagnosis not present

## 2022-08-08 ENCOUNTER — Other Ambulatory Visit: Payer: Self-pay

## 2022-08-10 ENCOUNTER — Other Ambulatory Visit: Payer: Self-pay

## 2022-08-10 MED ORDER — LEVOTHYROXINE SODIUM 25 MCG PO TABS
25.0000 ug | ORAL_TABLET | Freq: Every day | ORAL | 2 refills | Status: AC
  Filled 2022-08-10: qty 30, 30d supply, fill #0
  Filled 2022-09-17: qty 30, 30d supply, fill #1
  Filled 2022-11-02: qty 30, 30d supply, fill #2

## 2022-08-13 DIAGNOSIS — J301 Allergic rhinitis due to pollen: Secondary | ICD-10-CM | POA: Diagnosis not present

## 2022-08-19 ENCOUNTER — Other Ambulatory Visit: Payer: Self-pay

## 2022-08-20 ENCOUNTER — Other Ambulatory Visit: Payer: Self-pay

## 2022-08-20 DIAGNOSIS — J301 Allergic rhinitis due to pollen: Secondary | ICD-10-CM | POA: Diagnosis not present

## 2022-08-27 DIAGNOSIS — J301 Allergic rhinitis due to pollen: Secondary | ICD-10-CM | POA: Diagnosis not present

## 2022-09-01 DIAGNOSIS — J309 Allergic rhinitis, unspecified: Secondary | ICD-10-CM | POA: Diagnosis not present

## 2022-09-01 DIAGNOSIS — E559 Vitamin D deficiency, unspecified: Secondary | ICD-10-CM | POA: Diagnosis not present

## 2022-09-01 DIAGNOSIS — E039 Hypothyroidism, unspecified: Secondary | ICD-10-CM | POA: Diagnosis not present

## 2022-09-01 DIAGNOSIS — E119 Type 2 diabetes mellitus without complications: Secondary | ICD-10-CM | POA: Diagnosis not present

## 2022-09-15 DIAGNOSIS — J301 Allergic rhinitis due to pollen: Secondary | ICD-10-CM | POA: Diagnosis not present

## 2022-09-16 ENCOUNTER — Other Ambulatory Visit: Payer: Self-pay

## 2022-09-16 DIAGNOSIS — E559 Vitamin D deficiency, unspecified: Secondary | ICD-10-CM | POA: Diagnosis not present

## 2022-09-16 DIAGNOSIS — G4700 Insomnia, unspecified: Secondary | ICD-10-CM | POA: Diagnosis not present

## 2022-09-16 DIAGNOSIS — J309 Allergic rhinitis, unspecified: Secondary | ICD-10-CM | POA: Diagnosis not present

## 2022-09-16 DIAGNOSIS — E039 Hypothyroidism, unspecified: Secondary | ICD-10-CM | POA: Diagnosis not present

## 2022-09-16 DIAGNOSIS — E119 Type 2 diabetes mellitus without complications: Secondary | ICD-10-CM | POA: Diagnosis not present

## 2022-09-16 MED ORDER — VITAMIN D (ERGOCALCIFEROL) 1.25 MG (50000 UNIT) PO CAPS
50000.0000 [IU] | ORAL_CAPSULE | ORAL | 0 refills | Status: AC
  Filled 2022-09-16: qty 12, 84d supply, fill #0

## 2022-09-16 MED ORDER — ALBUTEROL SULFATE HFA 108 (90 BASE) MCG/ACT IN AERS
2.0000 | INHALATION_SPRAY | Freq: Four times a day (QID) | RESPIRATORY_TRACT | 1 refills | Status: AC | PRN
  Filled 2022-09-16: qty 6.7, 25d supply, fill #0
  Filled 2022-10-14: qty 6.7, 25d supply, fill #1

## 2022-09-16 MED ORDER — MOUNJARO 2.5 MG/0.5ML ~~LOC~~ SOAJ
2.5000 mg | SUBCUTANEOUS | 3 refills | Status: DC
  Filled 2022-09-16: qty 2, 28d supply, fill #0
  Filled 2022-10-14: qty 2, 28d supply, fill #1
  Filled 2022-11-02 – 2022-12-16 (×2): qty 2, 28d supply, fill #2
  Filled 2023-03-12: qty 2, 28d supply, fill #3

## 2022-09-16 MED ORDER — TRAZODONE HCL 50 MG PO TABS
50.0000 mg | ORAL_TABLET | Freq: Every day | ORAL | 0 refills | Status: DC
  Filled 2022-09-16: qty 30, 30d supply, fill #0

## 2022-09-17 ENCOUNTER — Other Ambulatory Visit: Payer: Self-pay

## 2022-09-22 ENCOUNTER — Other Ambulatory Visit: Payer: Self-pay

## 2022-09-22 DIAGNOSIS — R7303 Prediabetes: Secondary | ICD-10-CM | POA: Diagnosis not present

## 2022-09-22 DIAGNOSIS — K0889 Other specified disorders of teeth and supporting structures: Secondary | ICD-10-CM | POA: Diagnosis not present

## 2022-09-22 MED ORDER — AMOXICILLIN-POT CLAVULANATE 875-125 MG PO TABS
1.0000 | ORAL_TABLET | Freq: Two times a day (BID) | ORAL | 0 refills | Status: DC
  Filled 2022-09-22: qty 14, 7d supply, fill #0

## 2022-09-22 MED ORDER — HYDROCODONE-ACETAMINOPHEN 5-325 MG PO TABS
1.0000 | ORAL_TABLET | Freq: Four times a day (QID) | ORAL | 0 refills | Status: DC | PRN
  Filled 2022-09-22: qty 28, 7d supply, fill #0

## 2022-09-23 ENCOUNTER — Other Ambulatory Visit: Payer: Self-pay

## 2022-09-23 MED ORDER — FLUCONAZOLE 150 MG PO TABS
ORAL_TABLET | ORAL | 1 refills | Status: DC
  Filled 2022-09-23: qty 1, 1d supply, fill #0
  Filled 2022-10-18: qty 1, 1d supply, fill #1

## 2022-10-14 ENCOUNTER — Other Ambulatory Visit: Payer: Self-pay

## 2022-10-15 ENCOUNTER — Other Ambulatory Visit: Payer: Self-pay

## 2022-10-15 MED ORDER — TRAZODONE HCL 50 MG PO TABS
50.0000 mg | ORAL_TABLET | Freq: Every day | ORAL | 5 refills | Status: AC
  Filled 2022-10-15: qty 30, 30d supply, fill #0
  Filled 2022-11-02: qty 30, 30d supply, fill #1
  Filled 2022-12-24: qty 30, 30d supply, fill #2
  Filled 2023-02-10: qty 30, 30d supply, fill #3
  Filled 2023-04-05: qty 30, 30d supply, fill #4
  Filled 2023-07-16: qty 30, 30d supply, fill #5

## 2022-11-02 ENCOUNTER — Other Ambulatory Visit: Payer: Self-pay

## 2022-11-03 ENCOUNTER — Other Ambulatory Visit: Payer: Self-pay

## 2022-11-05 ENCOUNTER — Other Ambulatory Visit: Payer: Self-pay

## 2022-11-09 ENCOUNTER — Other Ambulatory Visit: Payer: Self-pay

## 2022-12-16 ENCOUNTER — Other Ambulatory Visit: Payer: Self-pay

## 2022-12-24 ENCOUNTER — Other Ambulatory Visit: Payer: Self-pay

## 2022-12-25 ENCOUNTER — Other Ambulatory Visit: Payer: Self-pay

## 2022-12-27 ENCOUNTER — Other Ambulatory Visit: Payer: Self-pay

## 2022-12-28 ENCOUNTER — Other Ambulatory Visit: Payer: Self-pay

## 2022-12-28 MED ORDER — METFORMIN HCL ER 500 MG PO TB24
500.0000 mg | ORAL_TABLET | Freq: Two times a day (BID) | ORAL | 3 refills | Status: DC
  Filled 2022-12-28: qty 60, 30d supply, fill #0
  Filled 2023-04-05: qty 60, 30d supply, fill #1
  Filled 2023-07-16: qty 60, 30d supply, fill #2
  Filled 2023-12-20: qty 60, 30d supply, fill #3

## 2023-01-13 ENCOUNTER — Other Ambulatory Visit: Payer: Self-pay

## 2023-01-13 DIAGNOSIS — E119 Type 2 diabetes mellitus without complications: Secondary | ICD-10-CM | POA: Diagnosis not present

## 2023-01-29 ENCOUNTER — Other Ambulatory Visit: Payer: Self-pay

## 2023-01-29 DIAGNOSIS — F411 Generalized anxiety disorder: Secondary | ICD-10-CM | POA: Diagnosis not present

## 2023-01-29 DIAGNOSIS — E039 Hypothyroidism, unspecified: Secondary | ICD-10-CM | POA: Diagnosis not present

## 2023-01-29 DIAGNOSIS — E559 Vitamin D deficiency, unspecified: Secondary | ICD-10-CM | POA: Diagnosis not present

## 2023-01-29 DIAGNOSIS — R7303 Prediabetes: Secondary | ICD-10-CM | POA: Diagnosis not present

## 2023-01-29 MED ORDER — ALPRAZOLAM 0.25 MG PO TABS
0.2500 mg | ORAL_TABLET | Freq: Every day | ORAL | 0 refills | Status: AC | PRN
  Filled 2023-01-29: qty 30, 30d supply, fill #0

## 2023-02-10 ENCOUNTER — Other Ambulatory Visit: Payer: Self-pay | Admitting: Family Medicine

## 2023-02-10 ENCOUNTER — Other Ambulatory Visit: Payer: Self-pay

## 2023-02-10 DIAGNOSIS — Z1231 Encounter for screening mammogram for malignant neoplasm of breast: Secondary | ICD-10-CM

## 2023-02-12 ENCOUNTER — Other Ambulatory Visit: Payer: Self-pay

## 2023-02-12 ENCOUNTER — Ambulatory Visit
Admission: RE | Admit: 2023-02-12 | Discharge: 2023-02-12 | Disposition: A | Discharge status: Home / Self Care | Payer: 59 | Source: Ambulatory Visit | Attending: Family Medicine | Admitting: Family Medicine

## 2023-02-12 DIAGNOSIS — Z1231 Encounter for screening mammogram for malignant neoplasm of breast: Secondary | ICD-10-CM | POA: Diagnosis not present

## 2023-02-16 ENCOUNTER — Other Ambulatory Visit: Payer: Self-pay

## 2023-02-17 ENCOUNTER — Other Ambulatory Visit: Payer: Self-pay

## 2023-02-17 MED ORDER — VITAMIN D (ERGOCALCIFEROL) 1.25 MG (50000 UNIT) PO CAPS
50000.0000 [IU] | ORAL_CAPSULE | ORAL | 0 refills | Status: AC
  Filled 2023-02-17: qty 12, 84d supply, fill #0

## 2023-02-26 ENCOUNTER — Other Ambulatory Visit (HOSPITAL_COMMUNITY): Payer: Self-pay

## 2023-02-26 ENCOUNTER — Other Ambulatory Visit: Payer: Self-pay

## 2023-02-26 DIAGNOSIS — E039 Hypothyroidism, unspecified: Secondary | ICD-10-CM | POA: Diagnosis not present

## 2023-02-26 DIAGNOSIS — E559 Vitamin D deficiency, unspecified: Secondary | ICD-10-CM | POA: Diagnosis not present

## 2023-02-26 DIAGNOSIS — R7303 Prediabetes: Secondary | ICD-10-CM | POA: Diagnosis not present

## 2023-02-26 DIAGNOSIS — F411 Generalized anxiety disorder: Secondary | ICD-10-CM | POA: Diagnosis not present

## 2023-02-26 MED ORDER — BUSPIRONE HCL 5 MG PO TABS
5.0000 mg | ORAL_TABLET | Freq: Two times a day (BID) | ORAL | 2 refills | Status: DC
  Filled 2023-02-26 (×2): qty 60, 30d supply, fill #0

## 2023-03-01 ENCOUNTER — Other Ambulatory Visit: Payer: Self-pay

## 2023-03-01 MED ORDER — ALPRAZOLAM 0.25 MG PO TABS
0.2500 mg | ORAL_TABLET | Freq: Every day | ORAL | 0 refills | Status: AC | PRN
  Filled 2023-03-01: qty 15, 30d supply, fill #0

## 2023-03-11 DIAGNOSIS — E119 Type 2 diabetes mellitus without complications: Secondary | ICD-10-CM | POA: Diagnosis not present

## 2023-03-11 DIAGNOSIS — E039 Hypothyroidism, unspecified: Secondary | ICD-10-CM | POA: Diagnosis not present

## 2023-03-11 DIAGNOSIS — E559 Vitamin D deficiency, unspecified: Secondary | ICD-10-CM | POA: Diagnosis not present

## 2023-03-12 ENCOUNTER — Other Ambulatory Visit (HOSPITAL_COMMUNITY): Payer: Self-pay

## 2023-03-18 ENCOUNTER — Other Ambulatory Visit: Payer: Self-pay

## 2023-03-18 DIAGNOSIS — E559 Vitamin D deficiency, unspecified: Secondary | ICD-10-CM | POA: Diagnosis not present

## 2023-03-18 DIAGNOSIS — E119 Type 2 diabetes mellitus without complications: Secondary | ICD-10-CM | POA: Diagnosis not present

## 2023-03-18 DIAGNOSIS — Z1211 Encounter for screening for malignant neoplasm of colon: Secondary | ICD-10-CM | POA: Diagnosis not present

## 2023-03-18 DIAGNOSIS — E039 Hypothyroidism, unspecified: Secondary | ICD-10-CM | POA: Diagnosis not present

## 2023-03-18 DIAGNOSIS — G4700 Insomnia, unspecified: Secondary | ICD-10-CM | POA: Diagnosis not present

## 2023-03-18 DIAGNOSIS — F411 Generalized anxiety disorder: Secondary | ICD-10-CM | POA: Diagnosis not present

## 2023-03-18 DIAGNOSIS — Z0000 Encounter for general adult medical examination without abnormal findings: Secondary | ICD-10-CM | POA: Diagnosis not present

## 2023-03-18 MED ORDER — BUSPIRONE HCL 7.5 MG PO TABS
7.5000 mg | ORAL_TABLET | Freq: Two times a day (BID) | ORAL | 1 refills | Status: AC
  Filled 2023-03-18: qty 60, 30d supply, fill #0
  Filled 2023-07-16: qty 60, 30d supply, fill #1

## 2023-03-18 MED ORDER — MOUNJARO 5 MG/0.5ML ~~LOC~~ SOAJ
5.0000 mg | SUBCUTANEOUS | 3 refills | Status: AC
Start: 2023-03-18 — End: ?
  Filled 2023-03-18 – 2023-04-07 (×2): qty 2, 28d supply, fill #0
  Filled 2023-07-02 – 2023-08-31 (×2): qty 2, 28d supply, fill #1
  Filled 2023-10-26: qty 2, 28d supply, fill #2
  Filled 2023-12-20: qty 2, 28d supply, fill #3

## 2023-03-19 ENCOUNTER — Other Ambulatory Visit: Payer: Self-pay

## 2023-03-19 MED ORDER — MOUNJARO 5 MG/0.5ML ~~LOC~~ SOAJ
5.0000 mg | SUBCUTANEOUS | 3 refills | Status: AC
Start: 2023-03-19 — End: ?
  Filled 2023-03-19 – 2023-05-03 (×4): qty 2, 28d supply, fill #0
  Filled 2023-05-31: qty 2, 28d supply, fill #1
  Filled 2023-06-28 – 2023-07-05 (×3): qty 2, 28d supply, fill #2
  Filled 2023-07-29: qty 2, 28d supply, fill #3

## 2023-03-19 MED ORDER — MONTELUKAST SODIUM 10 MG PO TABS
10.0000 mg | ORAL_TABLET | Freq: Every day | ORAL | 11 refills | Status: AC
  Filled 2023-03-19 – 2023-04-05 (×2): qty 30, 30d supply, fill #0

## 2023-03-19 MED ORDER — LEVOTHYROXINE SODIUM 25 MCG PO TABS
25.0000 ug | ORAL_TABLET | Freq: Every day | ORAL | 1 refills | Status: DC
  Filled 2023-03-19: qty 90, 90d supply, fill #0
  Filled 2023-04-05 – 2023-07-16 (×2): qty 90, 90d supply, fill #1
  Filled 2024-01-25: qty 20, 20d supply, fill #2

## 2023-03-19 MED ORDER — BUSPIRONE HCL 7.5 MG PO TABS
7.5000 mg | ORAL_TABLET | Freq: Two times a day (BID) | ORAL | 1 refills | Status: DC
  Filled 2023-03-19 – 2023-04-05 (×2): qty 60, 30d supply, fill #0
  Filled 2023-06-18: qty 60, 30d supply, fill #1

## 2023-03-19 MED ORDER — ALBUTEROL SULFATE HFA 108 (90 BASE) MCG/ACT IN AERS
2.0000 | INHALATION_SPRAY | Freq: Four times a day (QID) | RESPIRATORY_TRACT | 1 refills | Status: AC | PRN
  Filled 2023-03-19 – 2023-04-05 (×2): qty 6.7, 25d supply, fill #0
  Filled 2023-07-16: qty 6.7, 25d supply, fill #1

## 2023-03-24 ENCOUNTER — Other Ambulatory Visit: Payer: Self-pay

## 2023-03-24 ENCOUNTER — Telehealth: Payer: Self-pay | Admitting: *Deleted

## 2023-03-24 ENCOUNTER — Other Ambulatory Visit: Payer: Self-pay | Admitting: *Deleted

## 2023-03-24 DIAGNOSIS — Z1211 Encounter for screening for malignant neoplasm of colon: Secondary | ICD-10-CM

## 2023-03-24 MED ORDER — NA SULFATE-K SULFATE-MG SULF 17.5-3.13-1.6 GM/177ML PO SOLN
1.0000 | Freq: Once | ORAL | 0 refills | Status: AC
  Filled 2023-03-24 – 2023-04-28 (×2): qty 354, 1d supply, fill #0

## 2023-03-24 NOTE — Telephone Encounter (Signed)
Gastroenterology Pre-Procedure Review  Request Date: 05/07/2023 Requesting Physician: Dr. Allegra Lai  PATIENT REVIEW QUESTIONS: The patient responded to the following health history questions as indicated:    1. Are you having any GI issues? no 2. Do you have a personal history of Polyps? no 3. Do you have a family history of Colon Cancer or Polyps? yes (mom had colon polyps) 4. Diabetes Mellitus? yes (taking metformin and Mounjaro) 5. Joint replacements in the past 12 months?no 6. Major health problems in the past 3 months?no 7. Any artificial heart valves, MVP, or defibrillator?no    MEDICATIONS & ALLERGIES:    Patient reports the following regarding taking any anticoagulation/antiplatelet therapy:   Plavix, Coumadin, Eliquis, Xarelto, Lovenox, Pradaxa, Brilinta, or Effient? no Aspirin? no  Patient confirms/reports the following medications:  Current Outpatient Medications  Medication Sig Dispense Refill   Na Sulfate-K Sulfate-Mg Sulf 17.5-3.13-1.6 GM/177ML SOLN Take 1 kit by mouth once for 1 dose. 354 mL 0   albuterol (VENTOLIN HFA) 108 (90 Base) MCG/ACT inhaler 2 puffs q.i.d. p.r.n. short of breath, wheezing, or cough 18 g 0   albuterol (VENTOLIN HFA) 108 (90 Base) MCG/ACT inhaler Inhale 2 puffs into the lungs 4 (four) times daily as needed for shortness of breath, wheezing or cough. 6.7 g 0   albuterol (VENTOLIN HFA) 108 (90 Base) MCG/ACT inhaler Inhale 2 puffs into the lungs 4 (four) times daily as needed for short of breath, wheezing, or cough 6.7 g 1   albuterol (VENTOLIN HFA) 108 (90 Base) MCG/ACT inhaler Inhale 2 puffs into the lungs 4 (four) times daily as needed for shortness of breath, wheezing, or cough 6.7 g 1   ALPRAZolam (XANAX) 0.25 MG tablet Take 1 tablet (0.25 mg total) by mouth daily as needed for sleep. 30 tablet 0   ALPRAZolam (XANAX) 0.25 MG tablet Take 1 tablet (0.25 mg total) by mouth daily as needed for sleep for up to 30 days. 15 tablet 0   amoxicillin-clavulanate  (AUGMENTIN) 875-125 MG tablet Take 1 tablet (875 mg total) by mouth every 12 (twelve) hours for 7 days 14 tablet 0   amoxicillin-clavulanate (AUGMENTIN) 875-125 MG tablet Take 1 tablet by mouth every 12 (twelve) hours for 7 days 14 tablet 0   busPIRone (BUSPAR) 7.5 MG tablet Take 1 tablet (7.5 mg total) by mouth 2 (two) times daily. 60 tablet 1   busPIRone (BUSPAR) 7.5 MG tablet Take 1 tablet (7.5 mg total) by mouth 2 (two) times daily. 60 tablet 1   chlorpheniramine-HYDROcodone (TUSSIONEX) 10-8 MG/5ML Take 5 mLs by mouth every 12 (twelve) hours as needed. 70 mL 0   chlorpheniramine-HYDROcodone (TUSSIONEX) 10-8 MG/5ML Take 5 mLs by mouth at bedtime as needed for up to 7 days. 70 mL 0   Cholecalciferol 25 MCG (1000 UT) capsule Take by mouth.     EPINEPHrine (EPIPEN 2-PAK) 0.3 mg/0.3 mL IJ SOAJ injection Inject into thigh once as needed for severe allergic reaction. 2 each 0   fluconazole (DIFLUCAN) 150 MG tablet Take 1 tablet (150 mg total) by mouth once for 1 dose May repeat x1 if symptoms recur. Refill on prescription. 1 tablet 1   HYDROcodone-acetaminophen (NORCO/VICODIN) 5-325 MG tablet Take 1 tablet by mouth every 6 (six) hours as needed for pain. 28 tablet 0   levofloxacin (LEVAQUIN) 500 MG tablet Take 1 tablet (500 mg total) by mouth once daily for 7 days. 7 tablet 0   levothyroxine (SYNTHROID) 25 MCG tablet Take 1 tablet (25 mcg total) by mouth daily.  Take on an empty stomach with a glass of water at least 30-60 minutes before breakfast 30 tablet 2   levothyroxine (SYNTHROID) 25 MCG tablet Take 1 tablet (25 mcg total) by mouth daily. Take on an empty stomach with a glass of water at least 30-60 minutes before breakfast. 100 tablet 1   levothyroxine (SYNTHROID) 75 MCG tablet Take 1 tablet (75 mcg total) by mouth daily. 90 tablet 0   metFORMIN (GLUCOPHAGE-XR) 500 MG 24 hr tablet Take 1 tablet (500 mg total) by mouth daily with dinner 30 tablet 3   metFORMIN (GLUCOPHAGE-XR) 500 MG 24 hr tablet  Take 1 tablet (500 mg total) by mouth 2 (two) times daily 60 tablet 3   metFORMIN (GLUCOPHAGE-XR) 500 MG 24 hr tablet Take 1 tablet (500 mg total) by mouth 2 (two) times daily 60 tablet 3   montelukast (SINGULAIR) 10 MG tablet Take 1 tablet (10 mg total) by mouth at bedtime. 30 tablet 11   predniSONE (DELTASONE) 20 MG tablet Take 1 tablet (20 mg total) by mouth once daily for 5 days 5 tablet 0   tirzepatide (MOUNJARO) 2.5 MG/0.5ML Pen Inject 2.5 mg into the skin every 7 (seven) days. 2 mL 3   tirzepatide (MOUNJARO) 2.5 MG/0.5ML Pen Inject 2.5 mg into the skin once a week. 2 mL 3   tirzepatide (MOUNJARO) 5 MG/0.5ML Pen Inject one pen (5 mg) under the skin once a week. 2 mL 3   tirzepatide (MOUNJARO) 5 MG/0.5ML Pen Inject 5 mg into the skin once a week. 2 mL 3   traZODone (DESYREL) 50 MG tablet Take 1 tablet (50 mg total) by mouth at bedtime. 30 tablet 5   triamcinolone (NASACORT) 55 MCG/ACT AERO nasal inhaler Place 2 sprays into both nostrils once daily 10.8 mL 12   Vitamin D, Ergocalciferol, (DRISDOL) 1.25 MG (50000 UNIT) CAPS capsule Take 1 capsule (50,000 Units total) by mouth once a week. 12 capsule 0   Vitamin D, Ergocalciferol, (DRISDOL) 1.25 MG (50000 UNIT) CAPS capsule Take 1 capsule (50,000 Units total) by mouth once a week. 12 capsule 0   No current facility-administered medications for this visit.    Patient confirms/reports the following allergies:  No Known Allergies  No orders of the defined types were placed in this encounter.   AUTHORIZATION INFORMATION Primary Insurance: 1D#: Group #:  Secondary Insurance: 1D#: Group #:  SCHEDULE INFORMATION: Date: 05/07/2023 Time: Location:  [facility]

## 2023-03-31 ENCOUNTER — Other Ambulatory Visit: Payer: Self-pay

## 2023-04-05 ENCOUNTER — Other Ambulatory Visit: Payer: Self-pay

## 2023-04-06 ENCOUNTER — Other Ambulatory Visit: Payer: Self-pay

## 2023-04-07 ENCOUNTER — Other Ambulatory Visit: Payer: Self-pay

## 2023-04-12 ENCOUNTER — Other Ambulatory Visit: Payer: Self-pay

## 2023-04-28 ENCOUNTER — Other Ambulatory Visit: Payer: Self-pay

## 2023-04-28 ENCOUNTER — Encounter: Payer: Self-pay | Admitting: Gastroenterology

## 2023-05-03 ENCOUNTER — Other Ambulatory Visit: Payer: Self-pay

## 2023-05-07 ENCOUNTER — Ambulatory Visit
Admission: RE | Admit: 2023-05-07 | Discharge: 2023-05-07 | Disposition: A | Discharge status: Home / Self Care | Payer: 59 | Attending: Gastroenterology | Admitting: Gastroenterology

## 2023-05-07 ENCOUNTER — Ambulatory Visit: Payer: 59 | Admitting: Anesthesiology

## 2023-05-07 ENCOUNTER — Encounter
Admission: RE | Disposition: A | Discharge status: Home / Self Care | Payer: Self-pay | Source: Home / Self Care | Attending: Gastroenterology

## 2023-05-07 ENCOUNTER — Encounter: Payer: Self-pay | Admitting: Gastroenterology

## 2023-05-07 DIAGNOSIS — K621 Rectal polyp: Secondary | ICD-10-CM

## 2023-05-07 DIAGNOSIS — K5730 Diverticulosis of large intestine without perforation or abscess without bleeding: Secondary | ICD-10-CM | POA: Insufficient documentation

## 2023-05-07 DIAGNOSIS — Z6841 Body Mass Index (BMI) 40.0 and over, adult: Secondary | ICD-10-CM | POA: Diagnosis not present

## 2023-05-07 DIAGNOSIS — Z1211 Encounter for screening for malignant neoplasm of colon: Secondary | ICD-10-CM | POA: Diagnosis not present

## 2023-05-07 DIAGNOSIS — Z7984 Long term (current) use of oral hypoglycemic drugs: Secondary | ICD-10-CM | POA: Diagnosis not present

## 2023-05-07 DIAGNOSIS — E119 Type 2 diabetes mellitus without complications: Secondary | ICD-10-CM | POA: Insufficient documentation

## 2023-05-07 DIAGNOSIS — D128 Benign neoplasm of rectum: Secondary | ICD-10-CM | POA: Insufficient documentation

## 2023-05-07 HISTORY — PX: POLYPECTOMY: SHX5525

## 2023-05-07 HISTORY — DX: Encounter for procreative management, unspecified: Z31.9

## 2023-05-07 HISTORY — DX: Type 2 diabetes mellitus without complications: E11.9

## 2023-05-07 HISTORY — DX: Disorder of thyroid, unspecified: E07.9

## 2023-05-07 HISTORY — PX: COLONOSCOPY WITH PROPOFOL: SHX5780

## 2023-05-07 HISTORY — DX: Cardiac murmur, unspecified: R01.1

## 2023-05-07 LAB — POCT PREGNANCY, URINE: Preg Test, Ur: NEGATIVE

## 2023-05-07 SURGERY — COLONOSCOPY WITH PROPOFOL
Anesthesia: General

## 2023-05-07 MED ORDER — MIDAZOLAM HCL 2 MG/2ML IJ SOLN
INTRAMUSCULAR | Status: AC
  Filled 2023-05-07: qty 2

## 2023-05-07 MED ORDER — SODIUM CHLORIDE 0.9 % IV SOLN: INTRAVENOUS | Status: DC

## 2023-05-07 MED ORDER — MIDAZOLAM HCL 2 MG/2ML IJ SOLN
INTRAMUSCULAR | Status: DC | PRN
  Administered 2023-05-07: 2 mg via INTRAVENOUS

## 2023-05-07 MED ORDER — PROPOFOL 10 MG/ML IV BOLUS
INTRAVENOUS | Status: DC | PRN
  Administered 2023-05-07: 30 mg via INTRAVENOUS
  Administered 2023-05-07: 10 mg via INTRAVENOUS
  Administered 2023-05-07: 70 mg via INTRAVENOUS

## 2023-05-07 MED ORDER — LIDOCAINE HCL (CARDIAC) PF 100 MG/5ML IV SOSY
PREFILLED_SYRINGE | INTRAVENOUS | Status: DC | PRN
  Administered 2023-05-07: 100 mg via INTRAVENOUS

## 2023-05-07 MED ORDER — PROPOFOL 500 MG/50ML IV EMUL
INTRAVENOUS | Status: DC | PRN
  Administered 2023-05-07: 165 ug/kg/min via INTRAVENOUS

## 2023-05-07 NOTE — Anesthesia Postprocedure Evaluation (Signed)
Anesthesia Post Note  Patient: Theresa Skinner  Procedure(s) Performed: COLONOSCOPY WITH PROPOFOL POLYPECTOMY  Patient location during evaluation: PACU Anesthesia Type: General Level of consciousness: awake Pain management: pain level controlled Vital Signs Assessment: post-procedure vital signs reviewed and stable Cardiovascular status: blood pressure returned to baseline Anesthetic complications: no   No notable events documented.   Last Vitals:  Vitals:   05/07/23 1004 05/07/23 1014  BP: 124/80 122/85  Pulse: 70 79  Resp: 14 15  Temp:    SpO2: 100% 100%    Last Pain:  Vitals:   05/07/23 1014  TempSrc:   PainSc: 0-No pain                 VAN STAVEREN,Tamlyn Sides

## 2023-05-07 NOTE — Anesthesia Procedure Notes (Signed)
Procedure Name: General with mask airway Date/Time: 05/07/2023 9:35 AM  Performed by: Mohammed Kindle, CRNAPre-anesthesia Checklist: Patient identified, Emergency Drugs available, Suction available and Patient being monitored Oxygen Delivery Method: Simple face mask Induction Type: IV induction Placement Confirmation: positive ETCO2 and breath sounds checked- equal and bilateral Dental Injury: Teeth and Oropharynx as per pre-operative assessment

## 2023-05-07 NOTE — Anesthesia Preprocedure Evaluation (Signed)
Anesthesia Evaluation  Patient identified by MRN, date of birth, ID band Patient awake    Reviewed: Allergy & Precautions, NPO status , Patient's Chart, lab work & pertinent test results  Airway Mallampati: II  TM Distance: >3 FB Neck ROM: full    Dental  (+) Teeth Intact   Pulmonary neg pulmonary ROS   Pulmonary exam normal  + decreased breath sounds      Cardiovascular Exercise Tolerance: Good negative cardio ROS Normal cardiovascular exam Rhythm:Regular Rate:Normal     Neuro/Psych negative neurological ROS  negative psych ROS   GI/Hepatic negative GI ROS, Neg liver ROS,,,  Endo/Other  negative endocrine ROSdiabetes, Type 2, Oral Hypoglycemic AgentsHypothyroidism  Class 4 obesity  Renal/GU negative Renal ROS  negative genitourinary   Musculoskeletal negative musculoskeletal ROS (+)    Abdominal  (+) + obese  Peds negative pediatric ROS (+)  Hematology negative hematology ROS (+)   Anesthesia Other Findings Past Medical History: No date: Diabetes mellitus without complication (HCC) No date: Heart murmur No date: Infertility management No date: Pneumonia No date: Sinus infection No date: Thyroid disease No date: Upper respiratory infection  Past Surgical History: No date: WISDOM TOOTH EXTRACTION  BMI    Body Mass Index: 43.26 kg/m      Reproductive/Obstetrics negative OB ROS                             Anesthesia Physical Anesthesia Plan  ASA: 3  Anesthesia Plan: General   Post-op Pain Management:    Induction: Intravenous  PONV Risk Score and Plan: Propofol infusion and TIVA  Airway Management Planned: Natural Airway and Nasal Cannula  Additional Equipment:   Intra-op Plan:   Post-operative Plan:   Informed Consent: I have reviewed the patients History and Physical, chart, labs and discussed the procedure including the risks, benefits and alternatives for the  proposed anesthesia with the patient or authorized representative who has indicated his/her understanding and acceptance.     Dental Advisory Given  Plan Discussed with: CRNA and Surgeon  Anesthesia Plan Comments:        Anesthesia Quick Evaluation

## 2023-05-07 NOTE — Op Note (Signed)
[facility] Patient Name: Theresa Skinner Procedure Date: 05/07/2023 9:25 AM MRN: [id number] Account #: [id number] Date of Birth: [birth year] Admit Type: Outpatient Age: 47 Room: [facility] ENDO ROOM 2 Gender: Female Note Status: Finalized Instrument Name: Prentice Docker [id number] Procedure:             Colonoscopy Indications:           Screening for colorectal malignant neoplasm, This is                         the patient's first colonoscopy Providers:             Toney Reil MD, MD Referring MD:          Lisabeth Pick. Fields (Referring MD) Medicines:             General Anesthesia Complications:         No immediate complications. Estimated blood loss: None. Procedure:             Pre-Anesthesia Assessment:                        - Prior to the procedure, a History and Physical was                         performed, and patient medications and allergies were                         reviewed. The patient is competent. The risks and                         benefits of the procedure and the sedation options and                         risks were discussed with the patient. All questions                         were answered and informed consent was obtained.                         Patient identification and proposed procedure were                         verified by the physician, the nurse, the                         anesthesiologist, the anesthetist and the technician                         in the pre-procedure area in the procedure room in the                         endoscopy suite. Mental Status Examination: alert and                         oriented. Airway Examination: normal oropharyngeal                         airway and neck mobility. Respiratory Examination:  clear to auscultation. CV Examination: normal.                         Prophylactic Antibiotics: The patient does not require                         prophylactic  antibiotics. Prior Anticoagulants: The                         patient has taken no anticoagulant or antiplatelet                         agents. ASA Grade Assessment: III - A patient with                         severe systemic disease. After reviewing the risks and                         benefits, the patient was deemed in satisfactory                         condition to undergo the procedure. The anesthesia                         plan was to use general anesthesia. Immediately prior                         to administration of medications, the patient was                         re-assessed for adequacy to receive sedatives. The                         heart rate, respiratory rate, oxygen saturations,                         blood pressure, adequacy of pulmonary ventilation, and                         response to care were monitored throughout the                         procedure. The physical status of the patient was                         re-assessed after the procedure.                        After obtaining informed consent, the colonoscope was                         passed under direct vision. Throughout the procedure,                         the patient's blood pressure, pulse, and oxygen                         saturations were monitored continuously. The  Colonoscope was introduced through the anus and                         advanced to the the cecum, identified by appendiceal                         orifice and ileocecal valve. The colonoscopy was                         performed without difficulty. The patient tolerated                         the procedure well. The quality of the bowel                         preparation was evaluated using the BBPS [facility] Bowel                         Preparation Scale) with scores of: Right Colon = 3,                         Transverse Colon = 3 and Left Colon = 3 (entire mucosa                         seen  well with no residual staining, small fragments                         of stool or opaque liquid). The total BBPS score                         equals 9. The ileocecal valve, appendiceal orifice,                         and rectum were photographed. Findings:      The perianal and digital rectal examinations were normal. Pertinent       negatives include normal sphincter tone and no palpable rectal lesions.      A 4 mm polyp was found in the rectum. The polyp was sessile. The polyp       was removed with a cold snare. Resection and retrieval were complete.       Estimated blood loss: none.      Multiple small-mouthed diverticula were found in the recto-sigmoid colon       and sigmoid colon.      The retroflexed view of the distal rectum and anal verge was normal and       showed no anal or rectal abnormalities. Impression:            - One 4 mm polyp in the rectum, removed with a cold                         snare. Resected and retrieved.                        - Diverticulosis in the recto-sigmoid colon and in the                         sigmoid colon.                        -  The distal rectum and anal verge are normal on                         retroflexion view. Recommendation:        - Discharge patient to home (with escort).                        - Resume previous diet today.                        - Continue present medications.                        - Await pathology results.                        - Repeat colonoscopy in 7-10 years for surveillance                         based on pathology results. Procedure Code(s):     --- Professional ---                        [phone], Colonoscopy, flexible; with removal of                         tumor(s), polyp(s), or other lesion(s) by snare                         technique Diagnosis Code(s):     --- Professional ---                        Z12.11, Encounter for screening for malignant neoplasm                         of colon                         D12.8, Benign neoplasm of rectum                        K57.30, Diverticulosis of large intestine without                         perforation or abscess without bleeding CPT copyright 2022 American Medical Association. All rights reserved. The codes documented in this report are preliminary and upon coder review may  be revised to meet current compliance requirements. Dr. Libby Maw Toney Reil MD, MD 05/07/2023 9:53:14 AM This report has been signed electronically. Number of Addenda: 0 Note Initiated On: 05/07/2023 9:25 AM Scope Withdrawal Time: 0 hours 9 minutes 37 seconds  Total Procedure Duration: 0 hours 12 minutes 46 seconds  Estimated Blood Loss:  Estimated blood loss: none.      [facility]

## 2023-05-07 NOTE — H&P (Signed)
Arlyss Repress, MD [address]  [address]  [place], [place] [address]  Main: [phone]  Fax: [phone] Pager: [phone]  Primary Care Physician:  Alm Bustard, NP Primary Gastroenterologist:  Dr. Arlyss Repress  Pre-Procedure History & Physical: HPI:  Theresa Skinner is a 47 y.o. female is here for an colonoscopy.   Past Medical History:  Diagnosis Date   Diabetes mellitus without complication (HCC)    Heart murmur    Infertility management    Pneumonia    Sinus infection    Thyroid disease    Upper respiratory infection     Past Surgical History:  Procedure Laterality Date   WISDOM TOOTH EXTRACTION      Prior to Admission medications   Medication Sig Start Date End Date Taking? Authorizing Provider  albuterol (VENTOLIN HFA) 108 (90 Base) MCG/ACT inhaler Inhale 2 puffs into the lungs 4 (four) times daily as needed for shortness of breath, wheezing or cough. 07/24/22  Yes   ALPRAZolam (XANAX) 0.25 MG tablet Take 1 tablet (0.25 mg total) by mouth daily as needed for sleep. 01/29/23  Yes   busPIRone (BUSPAR) 7.5 MG tablet Take 1 tablet (7.5 mg total) by mouth 2 (two) times daily. 03/18/23  Yes   levothyroxine (SYNTHROID) 25 MCG tablet Take 1 tablet (25 mcg total) by mouth daily. Take on an empty stomach with a glass of water at least 30-60 minutes before breakfast 08/10/22  Yes   metFORMIN (GLUCOPHAGE-XR) 500 MG 24 hr tablet Take 1 tablet (500 mg total) by mouth daily with dinner 03/10/22  Yes   Vitamin D, Ergocalciferol, (DRISDOL) 1.25 MG (50000 UNIT) CAPS capsule Take 1 capsule (50,000 Units total) by mouth once a week. 09/16/22  Yes Fields, Glenda L, NP  Vitamin D, Ergocalciferol, (DRISDOL) 1.25 MG (50000 UNIT) CAPS capsule Take 1 capsule (50,000 Units total) by mouth once a week. 02/17/23  Yes   albuterol (VENTOLIN HFA) 108 (90 Base) MCG/ACT inhaler 2 puffs q.i.d. p.r.n. short of breath, wheezing, or cough 01/09/22     albuterol (VENTOLIN HFA) 108 (90 Base)  MCG/ACT inhaler Inhale 2 puffs into the lungs 4 (four) times daily as needed for short of breath, wheezing, or cough 09/16/22     albuterol (VENTOLIN HFA) 108 (90 Base) MCG/ACT inhaler Inhale 2 puffs into the lungs 4 (four) times daily as needed for shortness of breath, wheezing, or cough 03/19/23     ALPRAZolam (XANAX) 0.25 MG tablet Take 1 tablet (0.25 mg total) by mouth daily as needed for sleep for up to 30 days. 03/01/23     amoxicillin-clavulanate (AUGMENTIN) 875-125 MG tablet Take 1 tablet (875 mg total) by mouth every 12 (twelve) hours for 7 days Patient not taking: Reported on 04/28/2023 05/01/22     amoxicillin-clavulanate (AUGMENTIN) 875-125 MG tablet Take 1 tablet by mouth every 12 (twelve) hours for 7 days Patient not taking: Reported on 04/28/2023 09/22/22     busPIRone (BUSPAR) 7.5 MG tablet Take 1 tablet (7.5 mg total) by mouth 2 (two) times daily. 03/19/23     chlorpheniramine-HYDROcodone (TUSSIONEX) 10-8 MG/5ML Take 5 mLs by mouth every 12 (twelve) hours as needed. 05/12/22     chlorpheniramine-HYDROcodone (TUSSIONEX) 10-8 MG/5ML Take 5 mLs by mouth at bedtime as needed for up to 7 days. 07/14/22     Cholecalciferol 25 MCG (1000 UT) capsule Take by mouth.    [provider]  EPINEPHrine (EPIPEN 2-PAK) 0.3 mg/0.3 mL IJ SOAJ injection Inject into thigh once  as needed for severe allergic reaction. 02/17/22     fluconazole (DIFLUCAN) 150 MG tablet Take 1 tablet (150 mg total) by mouth once for 1 dose May repeat x1 if symptoms recur. Refill on prescription. 09/23/22     HYDROcodone-acetaminophen (NORCO/VICODIN) 5-325 MG tablet Take 1 tablet by mouth every 6 (six) hours as needed for pain. Patient not taking: Reported on 05/07/2023 09/22/22     levofloxacin (LEVAQUIN) 500 MG tablet Take 1 tablet (500 mg total) by mouth once daily for 7 days. Patient not taking: Reported on 04/28/2023 07/24/22     levothyroxine (SYNTHROID) 25 MCG tablet Take 1 tablet (25 mcg total) by mouth daily. Take on  an empty stomach with a glass of water at least 30-60 minutes before breakfast. 03/19/23     levothyroxine (SYNTHROID) 75 MCG tablet Take 1 tablet (75 mcg total) by mouth daily. 08/10/20   Copland, Ilona Sorrel, PA-C  metFORMIN (GLUCOPHAGE-XR) 500 MG 24 hr tablet Take 1 tablet (500 mg total) by mouth 2 (two) times daily 06/05/22     metFORMIN (GLUCOPHAGE-XR) 500 MG 24 hr tablet Take 1 tablet (500 mg total) by mouth 2 (two) times daily 12/28/22     montelukast (SINGULAIR) 10 MG tablet Take 1 tablet (10 mg total) by mouth at bedtime. 03/19/23     predniSONE (DELTASONE) 20 MG tablet Take 1 tablet (20 mg total) by mouth once daily for 5 days Patient not taking: Reported on 04/28/2023 07/24/22     tirzepatide [facility]) 2.5 MG/0.5ML Pen Inject 2.5 mg into the skin every 7 (seven) days. 06/17/22     tirzepatide (MOUNJARO) 2.5 MG/0.5ML Pen Inject 2.5 mg into the skin once a week. 07/19/22     tirzepatide (MOUNJARO) 5 MG/0.5ML Pen Inject one pen (5 mg) under the skin once a week. 03/18/23     tirzepatide (MOUNJARO) 5 MG/0.5ML Pen Inject 5 mg into the skin once a week. 03/19/23     traZODone (DESYREL) 50 MG tablet Take 1 tablet (50 mg total) by mouth at bedtime. 10/15/22     triamcinolone (NASACORT) 55 MCG/ACT AERO nasal inhaler Place 2 sprays into both nostrils once daily 01/13/22       Allergies as of 03/24/2023   (No Known Allergies)    Family History  Problem Relation Age of Onset   Diabetes Mother    Diabetes Father    Breast cancer Neg Hx     Social History   Socioeconomic History   Marital status: Married    Spouse name: Not on file   Number of children: Not on file   Years of education: Not on file   Highest education level: Not on file  Occupational History   Not on file  Tobacco Use   Smoking status: Never   Smokeless tobacco: Never  Vaping Use   Vaping status: Never Used  Substance and Sexual Activity   Alcohol use: Yes   Drug use: No   Sexual activity: Yes    Birth  control/protection: None  Other Topics Concern   Not on file  Social History Narrative   Not on file   Social Determinants of Health   Financial Resource Strain: Low Risk  (03/18/2023)   Received from [facility]   Overall Financial Resource Strain (CARDIA)    Difficulty of Paying Living Expenses: Not hard at all  Food Insecurity: No Food Insecurity (03/18/2023)   Received from [facility]   Hunger Vital Sign    Worried  About Running Out of Food in the Last Year: Never true    Ran Out of Food in the Last Year: Never true  Transportation Needs: No Transportation Needs (03/18/2023)   Received from [facility] - Transportation    In the past 12 months, has lack of transportation kept you from medical appointments or from getting medications?: No    Lack of Transportation (Non-Medical): No  Physical Activity: Not on file  Stress: Not on file  Social Connections: Not on file  Intimate Partner Violence: Not on file    Review of Systems: See HPI, otherwise negative ROS  Physical Exam: BP (!) 140/91   Pulse 76   Temp (!) 97.2 F (36.2 C) (Temporal)   Resp 18   Ht 5\' 6"  (1.676 m)   Wt 121.6 kg   SpO2 99%   BMI 43.26 kg/m  General:   Alert,  pleasant and cooperative in NAD Head:  Normocephalic and atraumatic. Neck:  Supple; no masses or thyromegaly. Lungs:  Clear throughout to auscultation.    Heart:  Regular rate and rhythm. Abdomen:  Soft, nontender and nondistended. Normal bowel sounds, without guarding, and without rebound.   Neurologic:  Alert and  oriented x4;  grossly normal neurologically.  Impression/Plan: Lavone A Appleby is here for an colonoscopy to be performed for colon cancer screening  Risks, benefits, limitations, and alternatives regarding  colonoscopy have been reviewed with the patient.  Questions have been answered.  All parties agreeable.   Lannette Donath, MD  05/07/2023, 9:24 AM

## 2023-05-07 NOTE — Transfer of Care (Signed)
Immediate Anesthesia Transfer of Care Note  Patient: Ori A Hickox  Procedure(s) Performed: COLONOSCOPY WITH PROPOFOL POLYPECTOMY  Patient Location: Endoscopy Unit  Anesthesia Type:General  Level of Consciousness: drowsy and patient cooperative  Airway & Oxygen Therapy: Patient Spontanous Breathing and Patient connected to face mask oxygen  Post-op Assessment: Report given to RN and Post -op Vital signs reviewed and stable  Post vital signs: Reviewed and stable  Last Vitals:  Vitals Value Taken Time  BP 104/74 05/07/23 0955  Temp 36.2 C 05/07/23 0954  Pulse 77 05/07/23 0957  Resp 15 05/07/23 0957  SpO2 100 % 05/07/23 0957  Vitals shown include unfiled device data.  Last Pain:  Vitals:   05/07/23 0954  TempSrc: Tympanic  PainSc: Asleep         Complications: No notable events documented.

## 2023-05-11 ENCOUNTER — Encounter: Payer: Self-pay | Admitting: Gastroenterology

## 2023-05-11 LAB — SURGICAL PATHOLOGY

## 2023-06-18 ENCOUNTER — Other Ambulatory Visit: Payer: Self-pay

## 2023-06-28 ENCOUNTER — Other Ambulatory Visit: Payer: Self-pay

## 2023-07-02 ENCOUNTER — Other Ambulatory Visit: Payer: Self-pay

## 2023-07-02 MED ORDER — MOUNJARO 5 MG/0.5ML ~~LOC~~ SOAJ
5.0000 mg | SUBCUTANEOUS | 3 refills | Status: DC
  Filled 2023-07-16 – 2023-09-28 (×2): qty 2, 28d supply, fill #0
  Filled 2023-11-21: qty 2, 28d supply, fill #1
  Filled 2024-01-25: qty 2, 28d supply, fill #2
  Filled 2024-02-23: qty 2, 28d supply, fill #3

## 2023-07-05 ENCOUNTER — Other Ambulatory Visit: Payer: Self-pay

## 2023-07-16 ENCOUNTER — Other Ambulatory Visit: Payer: Self-pay

## 2023-07-20 ENCOUNTER — Other Ambulatory Visit: Payer: Self-pay

## 2023-07-29 ENCOUNTER — Other Ambulatory Visit: Payer: Self-pay

## 2023-08-14 DIAGNOSIS — Z03818 Encounter for observation for suspected exposure to other biological agents ruled out: Secondary | ICD-10-CM | POA: Diagnosis not present

## 2023-08-14 DIAGNOSIS — J101 Influenza due to other identified influenza virus with other respiratory manifestations: Secondary | ICD-10-CM | POA: Diagnosis not present

## 2023-09-21 ENCOUNTER — Other Ambulatory Visit: Payer: Self-pay

## 2023-09-21 DIAGNOSIS — F411 Generalized anxiety disorder: Secondary | ICD-10-CM | POA: Diagnosis not present

## 2023-09-21 DIAGNOSIS — F4321 Adjustment disorder with depressed mood: Secondary | ICD-10-CM | POA: Diagnosis not present

## 2023-09-21 DIAGNOSIS — R7303 Prediabetes: Secondary | ICD-10-CM | POA: Diagnosis not present

## 2023-09-21 MED ORDER — ALPRAZOLAM 0.25 MG PO TABS
0.2500 mg | ORAL_TABLET | Freq: Two times a day (BID) | ORAL | 0 refills | Status: AC | PRN
  Filled 2023-09-21: qty 45, 23d supply, fill #0

## 2023-09-29 ENCOUNTER — Other Ambulatory Visit: Payer: Self-pay

## 2023-11-21 ENCOUNTER — Other Ambulatory Visit: Payer: Self-pay

## 2023-11-22 ENCOUNTER — Other Ambulatory Visit: Payer: Self-pay

## 2023-11-23 ENCOUNTER — Other Ambulatory Visit: Payer: Self-pay

## 2023-11-23 MED ORDER — BUSPIRONE HCL 7.5 MG PO TABS
7.5000 mg | ORAL_TABLET | Freq: Two times a day (BID) | ORAL | 1 refills | Status: DC
  Filled 2023-11-23: qty 60, 30d supply, fill #0
  Filled 2023-12-20: qty 60, 30d supply, fill #1

## 2024-01-18 ENCOUNTER — Other Ambulatory Visit: Payer: Self-pay | Admitting: Family Medicine

## 2024-01-18 DIAGNOSIS — Z1231 Encounter for screening mammogram for malignant neoplasm of breast: Secondary | ICD-10-CM

## 2024-01-25 ENCOUNTER — Other Ambulatory Visit: Payer: Self-pay

## 2024-01-26 ENCOUNTER — Other Ambulatory Visit: Payer: Self-pay

## 2024-01-26 MED ORDER — METFORMIN HCL ER 500 MG PO TB24
500.0000 mg | ORAL_TABLET | Freq: Two times a day (BID) | ORAL | 3 refills | Status: AC
  Filled 2024-01-26: qty 180, 90d supply, fill #0

## 2024-01-26 MED ORDER — TRAZODONE HCL 50 MG PO TABS
50.0000 mg | ORAL_TABLET | Freq: Every day | ORAL | 1 refills | Status: AC
  Filled 2024-01-26: qty 90, 90d supply, fill #0

## 2024-01-26 MED ORDER — BUSPIRONE HCL 7.5 MG PO TABS
7.5000 mg | ORAL_TABLET | Freq: Two times a day (BID) | ORAL | 1 refills | Status: DC
  Filled 2024-01-26: qty 60, 30d supply, fill #0
  Filled 2024-02-23: qty 60, 30d supply, fill #1

## 2024-02-01 NOTE — Progress Notes (Unsigned)
 Fields, Gaetana CROME, NP   No chief complaint on file.   HPI:      Ms. Theresa Skinner is a 48 y.o. G1P1001 whose LMP was No LMP recorded., presents today for *** IUD and ablation discussed Her menses are regular every 28-30 days, lasting 5-6 days, heavy flow for 2 days, changing tampon hourly, wears a pad for extra protection too (for 2 yrs).  Dysmenorrhea mild, occurring first 1-2 days of flow. She does not have intermenstrual bleeding. Hx of hypothyroidism, on meds. Pt considering ablation next yr for menses. Normal CBC 3/22   Patient Active Problem List   Diagnosis Date Noted   Screening for colon cancer 05/07/2023   Polyp of rectum 05/07/2023   Acquired hypothyroidism 08/09/2020   Sepsis (HCC) 10/14/2016    Past Surgical History:  Procedure Laterality Date   COLONOSCOPY WITH PROPOFOL  N/A 05/07/2023   Procedure: COLONOSCOPY WITH PROPOFOL ;  Surgeon: Unk Corinn Skiff, MD;  Location: [facility] ENDOSCOPY;  Service: Gastroenterology;  Laterality: N/A;   POLYPECTOMY  05/07/2023   Procedure: POLYPECTOMY;  Surgeon: Unk Corinn Skiff, MD;  Location: [facility] ENDOSCOPY;  Service: Gastroenterology;;   WISDOM TOOTH EXTRACTION      Family History  Problem Relation Age of Onset   Diabetes Mother    Diabetes Father    Breast cancer Neg Hx     Social History   Socioeconomic History   Marital status: Married    Spouse name: Not on file   Number of children: Not on file   Years of education: Not on file   Highest education level: Not on file  Occupational History   Not on file  Tobacco Use   Smoking status: Never   Smokeless tobacco: Never  Vaping Use   Vaping status: Never Used  Substance and Sexual Activity   Alcohol use: Yes   Drug use: No   Sexual activity: Yes    Birth control/protection: None  Other Topics Concern   Not on file  Social History Narrative   Not on file   Social Drivers of Health   Financial Resource Strain: Low Risk  (09/21/2023)   Received [facility]   Overall Financial Resource Strain (CARDIA)    Difficulty of Paying Living Expenses: Not hard at all  Food Insecurity: No Food Insecurity (09/21/2023)   Received from [facility]   Hunger Vital Sign    Within the past 12 months, you worried that your food would run out before you got the money to buy more.: Never true    Within the past 12 months, the food you bought just didn't last and you didn't have money to get more.: Never true  Transportation Needs: No Transportation Needs (09/21/2023)   Received [facility] Transportation    In the past 12 months, has lack of transportation kept you from medical appointments or from getting medications?: No    Lack of Transportation (Non-Medical): No  Physical Activity: Not on file  Stress: Not on file  Social Connections: Not on file  Intimate Partner Violence: Not on file    Outpatient Medications Prior to Visit  Medication Sig Dispense Refill   albuterol  (VENTOLIN  HFA) 108 (90 Base) MCG/ACT inhaler 2 puffs q.i.d. p.r.n. short of breath, wheezing, or cough 18 g 0   albuterol  (VENTOLIN  HFA) 108 (90 Base) MCG/ACT inhaler Inhale 2 puffs into the lungs 4 (four) times daily as needed for shortness  of breath, wheezing or cough. 6.7 g 0   albuterol  (VENTOLIN  HFA) 108 (90 Base) MCG/ACT inhaler Inhale 2 puffs into the lungs 4 (four) times daily as needed for short of breath, wheezing, or cough 6.7 g 1   albuterol  (VENTOLIN  HFA) 108 (90 Base) MCG/ACT inhaler Inhale 2 puffs into the lungs 4 (four) times daily as needed for shortness of breath, wheezing, or cough 6.7 g 1   ALPRAZolam  (XANAX ) 0.25 MG tablet Take 1 tablet (0.25 mg total) by mouth daily as needed for sleep. 30 tablet 0   ALPRAZolam  (XANAX ) 0.25 MG tablet Take 1 tablet (0.25 mg total) by mouth daily as needed for sleep for up to 30 days. 15 tablet 0   ALPRAZolam  (XANAX ) 0.25 MG tablet Take 1 tablet (0.25 mg total) by  mouth 2 (two) times daily as needed for sleep for up to 30 days. 45 tablet 0   amoxicillin -clavulanate (AUGMENTIN ) 875-125 MG tablet Take 1 tablet (875 mg total) by mouth every 12 (twelve) hours for 7 days (Patient not taking: Reported on 04/28/2023) 14 tablet 0   busPIRone  (BUSPAR ) 7.5 MG tablet Take 1 tablet (7.5 mg total) by mouth 2 (two) times daily. 60 tablet 1   busPIRone  (BUSPAR ) 7.5 MG tablet Take 1 tablet (7.5 mg total) by mouth 2 (two) times daily. 60 tablet 1   chlorpheniramine-HYDROcodone  (TUSSIONEX) 10-8 MG/5ML Take 5 mLs by mouth at bedtime as needed for up to 7 days. 70 mL 0   Cholecalciferol 25 MCG (1000 UT) capsule Take by mouth.     EPINEPHrine  (EPIPEN  2-PAK) 0.3 mg/0.3 mL IJ SOAJ injection Inject into thigh once as needed for severe allergic reaction. 2 each 0   fluconazole  (DIFLUCAN ) 150 MG tablet Take 1 tablet (150 mg total) by mouth once for 1 dose May repeat x1 if symptoms recur. Refill on prescription. 1 tablet 1   levothyroxine  (SYNTHROID ) 25 MCG tablet Take 1 tablet (25 mcg total) by mouth daily. Take on an empty stomach with a glass of water at least 30-60 minutes before breakfast 30 tablet 2   levothyroxine  (SYNTHROID ) 25 MCG tablet Take 1 tablet (25 mcg total) by mouth daily. Take on an empty stomach with a glass of water at least 30-60 minutes before breakfast. 100 tablet 1   metFORMIN  (GLUCOPHAGE -XR) 500 MG 24 hr tablet Take 1 tablet (500 mg total) by mouth daily with dinner 30 tablet 3   metFORMIN  (GLUCOPHAGE -XR) 500 MG 24 hr tablet Take 1 tablet (500 mg total) by mouth 2 (two) times daily 60 tablet 3   montelukast  (SINGULAIR ) 10 MG tablet Take 1 tablet (10 mg total) by mouth at bedtime. 30 tablet 11   tirzepatide  (MOUNJARO ) 2.5 MG/0.5ML Pen Inject 2.5 mg into the skin every 7 (seven) days. 2 mL 3   tirzepatide  (MOUNJARO ) 2.5 MG/0.5ML Pen Inject 2.5 mg into the skin once a week. 2 mL 3   tirzepatide  (MOUNJARO ) 5 MG/0.5ML Pen Inject one pen (5 mg) under the skin once a  week. 2 mL 3   tirzepatide  (MOUNJARO ) 5 MG/0.5ML Pen Inject 5 mg into the skin once a week. 2 mL 3   tirzepatide  (MOUNJARO ) 5 MG/0.5ML Pen Inject 5 mg into the skin once a week. 2 mL 3   traZODone  (DESYREL ) 50 MG tablet Take 1 tablet (50 mg total) by mouth at bedtime. 30 tablet 5   traZODone  (DESYREL ) 50 MG tablet Take 1 tablet (50 mg total) by mouth at bedtime. 90 tablet 1   triamcinolone  (  NASACORT ) 55 MCG/ACT AERO nasal inhaler Place 2 sprays into both nostrils once daily 10.8 mL 12   Vitamin D , Ergocalciferol , (DRISDOL ) 1.25 MG (50000 UNIT) CAPS capsule Take 1 capsule (50,000 Units total) by mouth once a week. 12 capsule 0   Vitamin D , Ergocalciferol , (DRISDOL ) 1.25 MG (50000 UNIT) CAPS capsule Take 1 capsule (50,000 Units total) by mouth once a week. 12 capsule 0   No facility-administered medications prior to visit.      ROS:  Review of Systems BREAST: No symptoms   OBJECTIVE:   Vitals:  There were no vitals taken for this visit.  Physical Exam  Results: No results found for this or any previous visit (from the past 24 hours).   Assessment/Plan: No diagnosis found.    No orders of the defined types were placed in this encounter.     No follow-ups on file.  Mara Favero B. Aston Lieske, PA-C 02/01/2024 4:58 PM

## 2024-02-02 ENCOUNTER — Ambulatory Visit (INDEPENDENT_AMBULATORY_CARE_PROVIDER_SITE_OTHER): Admitting: Obstetrics and Gynecology

## 2024-02-02 ENCOUNTER — Other Ambulatory Visit (HOSPITAL_COMMUNITY)
Admission: RE | Admit: 2024-02-02 | Discharge: 2024-02-02 | Disposition: A | Discharge status: Home / Self Care | Source: Ambulatory Visit | Attending: Obstetrics and Gynecology | Admitting: Obstetrics and Gynecology

## 2024-02-02 ENCOUNTER — Encounter: Payer: Self-pay | Admitting: Obstetrics and Gynecology

## 2024-02-02 VITALS — BP 150/106 | HR 111 | Ht 66.0 in | Wt 250.0 lb

## 2024-02-02 DIAGNOSIS — Z1151 Encounter for screening for human papillomavirus (HPV): Secondary | ICD-10-CM | POA: Insufficient documentation

## 2024-02-02 DIAGNOSIS — Z124 Encounter for screening for malignant neoplasm of cervix: Secondary | ICD-10-CM

## 2024-02-02 DIAGNOSIS — N920 Excessive and frequent menstruation with regular cycle: Secondary | ICD-10-CM | POA: Diagnosis not present

## 2024-02-02 NOTE — Patient Instructions (Signed)
 I value your feedback and you entrusting [place] with your care. If you get a [place] patient survey, I would appreciate you taking the time to let us know about your experience today. Thank you! ? ? ?

## 2024-02-04 LAB — CYTOLOGY - PAP
Comment: NEGATIVE
Diagnosis: UNDETERMINED — AB
High risk HPV: NEGATIVE

## 2024-02-06 ENCOUNTER — Ambulatory Visit: Payer: Self-pay | Admitting: Obstetrics and Gynecology

## 2024-02-07 ENCOUNTER — Ambulatory Visit
Admission: RE | Admit: 2024-02-07 | Discharge: 2024-02-07 | Disposition: A | Discharge status: Home / Self Care | Source: Ambulatory Visit | Attending: Obstetrics and Gynecology | Admitting: Obstetrics and Gynecology

## 2024-02-07 DIAGNOSIS — N920 Excessive and frequent menstruation with regular cycle: Secondary | ICD-10-CM | POA: Insufficient documentation

## 2024-02-07 DIAGNOSIS — N854 Malposition of uterus: Secondary | ICD-10-CM | POA: Diagnosis not present

## 2024-02-18 ENCOUNTER — Other Ambulatory Visit: Payer: Self-pay

## 2024-02-18 ENCOUNTER — Ambulatory Visit
Admission: RE | Admit: 2024-02-18 | Discharge: 2024-02-18 | Disposition: A | Discharge status: Home / Self Care | Source: Ambulatory Visit | Attending: Family Medicine | Admitting: Family Medicine

## 2024-02-18 DIAGNOSIS — Z1231 Encounter for screening mammogram for malignant neoplasm of breast: Secondary | ICD-10-CM | POA: Diagnosis not present

## 2024-02-18 MED ORDER — MICROGESTIN 24 FE 1-20 MG-MCG PO TABS
1.0000 | ORAL_TABLET | Freq: Every day | ORAL | 0 refills | Status: DC
  Filled 2024-02-18: qty 84, 84d supply, fill #0

## 2024-02-22 ENCOUNTER — Other Ambulatory Visit: Payer: Self-pay

## 2024-02-22 ENCOUNTER — Encounter: Payer: Self-pay | Admitting: Dermatology

## 2024-02-22 ENCOUNTER — Ambulatory Visit: Admitting: Dermatology

## 2024-02-22 DIAGNOSIS — L219 Seborrheic dermatitis, unspecified: Secondary | ICD-10-CM

## 2024-02-22 DIAGNOSIS — L719 Rosacea, unspecified: Secondary | ICD-10-CM

## 2024-02-22 DIAGNOSIS — Z7189 Other specified counseling: Secondary | ICD-10-CM

## 2024-02-22 MED ORDER — KETOCONAZOLE 2 % EX CREA
TOPICAL_CREAM | CUTANEOUS | 3 refills | Status: DC
  Filled 2024-02-22: qty 30, 15d supply, fill #0

## 2024-02-22 MED ORDER — METRONIDAZOLE 1 % EX CREA: TOPICAL_CREAM | Freq: Every day | CUTANEOUS | 3 refills | Status: AC

## 2024-02-22 NOTE — Progress Notes (Signed)
 New Patient Visit   Subjective  Theresa Skinner is a 48 y.o. female who presents for the following: Rash on face. Dur: ~1 year. Started on right temple. Itching. Spreading down face and around nose. Moisturizer does not help. Has not used Rx topicals or OTC HC cream. States is dry and flaky. Sister and father have had hx of skin cancer.     The following portions of the chart were reviewed this encounter and updated as appropriate: medications, allergies, medical history  Review of Systems:  No other skin or systemic complaints except as noted in HPI or Assessment and Plan.  Objective  Well appearing patient in no apparent distress; mood and affect are within normal limits.  A focused examination was performed of the following areas: Face   Relevant exam findings are noted in the Assessment and Plan.           Assessment & Plan   ROSACEA   SEBORRHEIC DERMATITIS     ROSACEA Exam Mid face erythema with telangiectasias, edematous pink papules temples lateral cheeks.  Chronic and persistent condition with duration or expected duration over one year. Condition is bothersome/symptomatic for patient. Currently flared.   Rosacea is a chronic progressive skin condition usually affecting the face of adults, causing redness and/or acne bumps. It is treatable but not curable. It sometimes affects the eyes (ocular rosacea) as well. It may respond to topical and/or systemic medication and can flare with stress, sun exposure, alcohol, exercise, topical steroids (including hydrocortisone/cortisone 10) and some foods.  Daily application of broad spectrum spf 30+ sunscreen to face is recommended to reduce flares.  Patient denies grittiness of the eyes  Treatment Plan Will prescribe [facility] metronidazole /ivermectin/azelaic acid twice daily as needed to affected areas on the face. The patient was advised this is not covered by insurance since it is made by a compounding  pharmacy. They will receive an email to check out and the medication will be mailed to their home.    Daily application of broad spectrum spf 30+ sunscreen to face is recommended to reduce flares.  Counseling for BBL / IPL / Laser and Coordination of Care Discussed the treatment option of Broad Band Morris (BBL) /Intense Pulsed Giel (IPL)/ Laser for skin discoloration, including brown spots and redness.  Typically we recommend at least 1-3 treatment sessions about 5-8 weeks apart for best results.  Cannot have tanned skin when BBL performed, and regular use of sunscreen/photoprotection is advised after the procedure to help maintain results. The patient's condition may also require maintenance treatments in the future.  The fee for BBL / laser treatments is $350 per treatment session for the whole face.  A fee can be quoted for other parts of the body.  Insurance typically does not pay for BBL/laser treatments and therefore the fee is an out-of-pocket cost. Recommend prophylactic valtrex treatment. Once scheduled for procedure, will send Rx in prior to patient's appointment.      SEBORRHEIC DERMATITIS Exam: Pink patches with greasy scale at alar creases and nasolabial folds  Chronic and persistent condition with duration or expected duration over one year. Condition is bothersome/symptomatic for patient. Currently flared.   Seborrheic Dermatitis is a chronic persistent rash characterized by pinkness and scaling most commonly of the mid face but also can occur on the scalp (dandruff), ears; mid chest, mid back and groin.  It tends to be exacerbated by stress and cooler weather.  People who have neurologic disease may experience new onset  or exacerbation of existing seborrheic dermatitis.  The condition is not curable but treatable and can be controlled.  Treatment Plan:   Start Ketoconazole  2% cream twice daily to affected scaly areas on face.    Return in about 9 weeks (around 04/25/2024)  for Rosacea Follow Up, Seborrheic Dermatitis Follow Up, TBSE.  I, Kate Fought, CMA, am acting as scribe for Boneta Sharps, MD.   Documentation: I have reviewed the above documentation for accuracy and completeness, and I agree with the above.  Boneta Sharps, MD

## 2024-02-22 NOTE — Patient Instructions (Addendum)
 Start Ketoconazole  2% cream twice daily to affected scaly areas on face.  Rosacea: Will prescribe [facility] metronidazole /ivermectin/azelaic acid twice daily as needed to affected areas on the face. The patient was advised this is not covered by insurance since it is made by a compounding pharmacy. They will receive an email to check out and the medication will be mailed to their home.    Instructions for [facility] Medications  One or more of your medications was sent to the [facility] mail order compounding pharmacy. You will receive an email from them and can purchase the medicine through that link. It will then be mailed to your home at the address you confirmed. If for any reason you do not receive an email from them, please check your spam folder. If you still do not find the email, please let us  know. [facility] phone number is [phone].    Rosacea is a chronic progressive skin condition usually affecting the face of adults, causing redness and/or acne bumps. It is treatable but not curable. It sometimes affects the eyes (ocular rosacea) as well. It may respond to topical and/or systemic medication and can flare with stress, sun exposure, alcohol, exercise, topical steroids (including hydrocortisone/cortisone 10) and some foods.  Daily application of broad spectrum spf 30+ sunscreen to face is recommended to reduce flares.   Counseling for BBL / IPL / Laser and Coordination of Care Discussed the treatment option of Broad Band Porzio (BBL) /Intense Pulsed Benn (IPL)/ Laser for skin discoloration, including brown spots and redness.  Typically we recommend at least 1-3 treatment sessions about 5-8 weeks apart for best results.  Cannot have tanned skin when BBL performed, and regular use of sunscreen/photoprotection is advised after the procedure to help maintain results. The patient's condition may also require maintenance treatments in the future.  The fee for BBL /  laser treatments is $350 per treatment session for the whole face.  A fee can be quoted for other parts of the body.  Insurance typically does not pay for BBL/laser treatments and therefore the fee is an out-of-pocket cost. Recommend prophylactic valtrex treatment. Once scheduled for procedure, will send Rx in prior to patient's appointment.

## 2024-02-23 ENCOUNTER — Other Ambulatory Visit: Payer: Self-pay

## 2024-02-24 ENCOUNTER — Other Ambulatory Visit: Payer: Self-pay

## 2024-02-24 MED ORDER — LEVOTHYROXINE SODIUM 25 MCG PO TABS
25.0000 ug | ORAL_TABLET | Freq: Every day | ORAL | 0 refills | Status: AC
  Filled 2024-02-24: qty 90, 90d supply, fill #0

## 2024-02-25 ENCOUNTER — Other Ambulatory Visit: Payer: Self-pay

## 2024-02-25 MED ORDER — ALBUTEROL SULFATE HFA 108 (90 BASE) MCG/ACT IN AERS
2.0000 | INHALATION_SPRAY | Freq: Four times a day (QID) | RESPIRATORY_TRACT | 1 refills | Status: AC | PRN
  Filled 2024-02-25: qty 6.7, 25d supply, fill #0
  Filled 2024-04-14: qty 6.7, 25d supply, fill #1

## 2024-03-22 ENCOUNTER — Other Ambulatory Visit: Payer: Self-pay

## 2024-03-22 DIAGNOSIS — Z1331 Encounter for screening for depression: Secondary | ICD-10-CM | POA: Diagnosis not present

## 2024-03-22 DIAGNOSIS — Z23 Encounter for immunization: Secondary | ICD-10-CM | POA: Diagnosis not present

## 2024-03-22 DIAGNOSIS — E559 Vitamin D deficiency, unspecified: Secondary | ICD-10-CM | POA: Diagnosis not present

## 2024-03-22 DIAGNOSIS — E119 Type 2 diabetes mellitus without complications: Secondary | ICD-10-CM | POA: Diagnosis not present

## 2024-03-22 DIAGNOSIS — Z0000 Encounter for general adult medical examination without abnormal findings: Secondary | ICD-10-CM | POA: Diagnosis not present

## 2024-03-22 DIAGNOSIS — E282 Polycystic ovarian syndrome: Secondary | ICD-10-CM | POA: Diagnosis not present

## 2024-03-22 DIAGNOSIS — G4700 Insomnia, unspecified: Secondary | ICD-10-CM | POA: Diagnosis not present

## 2024-03-22 DIAGNOSIS — E039 Hypothyroidism, unspecified: Secondary | ICD-10-CM | POA: Diagnosis not present

## 2024-03-22 DIAGNOSIS — F411 Generalized anxiety disorder: Secondary | ICD-10-CM | POA: Diagnosis not present

## 2024-03-22 MED ORDER — MOUNJARO 7.5 MG/0.5ML ~~LOC~~ SOAJ
7.5000 mg | SUBCUTANEOUS | 3 refills | Status: AC
  Filled 2024-03-22: qty 2, 28d supply, fill #0
  Filled 2024-04-14: qty 2, 28d supply, fill #1
  Filled 2024-05-12: qty 2, 28d supply, fill #2
  Filled 2024-06-09: qty 2, 28d supply, fill #3

## 2024-03-22 MED ORDER — ESCITALOPRAM OXALATE 5 MG PO TABS
5.0000 mg | ORAL_TABLET | Freq: Every day | ORAL | 2 refills | Status: DC
  Filled 2024-03-22: qty 90, 90d supply, fill #0

## 2024-04-20 ENCOUNTER — Other Ambulatory Visit: Payer: Self-pay

## 2024-04-20 ENCOUNTER — Encounter: Payer: Self-pay | Admitting: Dermatology

## 2024-04-20 ENCOUNTER — Ambulatory Visit: Admitting: Dermatology

## 2024-04-20 DIAGNOSIS — L219 Seborrheic dermatitis, unspecified: Secondary | ICD-10-CM | POA: Diagnosis not present

## 2024-04-20 DIAGNOSIS — L719 Rosacea, unspecified: Secondary | ICD-10-CM | POA: Diagnosis not present

## 2024-04-20 DIAGNOSIS — Z79899 Other long term (current) drug therapy: Secondary | ICD-10-CM | POA: Diagnosis not present

## 2024-04-20 MED ORDER — IVERMECTIN 1 % EX CREA: 1.0000 | TOPICAL_CREAM | Freq: Every day | CUTANEOUS | 11 refills | Status: AC

## 2024-04-20 MED ORDER — KETOCONAZOLE 2 % EX CREA
TOPICAL_CREAM | CUTANEOUS | 3 refills | Status: AC
  Filled 2024-04-20: qty 30, 30d supply, fill #0

## 2024-04-20 MED ORDER — HYDROCORTISONE 2.5 % EX CREA
TOPICAL_CREAM | Freq: Two times a day (BID) | CUTANEOUS | 6 refills | Status: AC | PRN
  Filled 2024-04-20: qty 30, 30d supply, fill #0

## 2024-04-20 NOTE — Patient Instructions (Addendum)
 Instructions for [facility] Medications  One or more of your medications was sent to the [facility] mail order compounding pharmacy. You will receive an email from them and can purchase the medicine through that link. It will then be mailed to your home at the address you confirmed. If for any reason you do not receive an email from them, please check your spam folder. If you still do not find the email, please let us  know. [facility] phone number is [phone].   Due to recent changes in healthcare laws, you may see results of your pathology and/or laboratory studies on MyChart before the doctors have had a chance to review them. We understand that in some cases there may be results that are confusing or concerning to you. Please understand that not all results are received at the same time and often the doctors may need to interpret multiple results in order to provide you with the best plan of care or course of treatment. Therefore, we ask that you please give us  2 business days to thoroughly review all your results before contacting the office for clarification. Should we see a critical lab result, you will be contacted sooner.   If You Need Anything After Your Visit  If you have any questions or concerns for your doctor, please call our main line at [phone] and press option 4 to reach your doctor's medical assistant. If no one answers, please leave a voicemail as directed and we will return your call as soon as possible. Messages left after 4 pm will be answered the following business day.   You may also send us  a message via MyChart. We typically respond to MyChart messages within 1-2 business days.  For prescription refills, please ask your pharmacy to contact our office. Our fax number is [phone].  If you have an urgent issue when the clinic is closed that cannot wait until the next business day, you can page your doctor at the number below.    Please note that  while we do our best to be available for urgent issues outside of office hours, we are not available 24/7.   If you have an urgent issue and are unable to reach us , you may choose to seek medical care at your doctor's office, retail clinic, urgent care center, or emergency room.  If you have a medical emergency, please immediately call 911 or go to the emergency department.  Pager Numbers  - Dr. Hester: [phone]  - Dr. Jackquline: [phone]  - Dr. Claudene: [phone]   - Dr. Raymund: [phone]  In the event of inclement weather, please call our main line at [phone] for an update on the status of any delays or closures.  Dermatology Medication Tips: Please keep the boxes that topical medications come in in order to help keep track of the instructions about where and how to use these. Pharmacies typically print the medication instructions only on the boxes and not directly on the medication tubes.   If your medication is too expensive, please contact our office at [phone] option 4 or send us  a message through MyChart.   We are unable to tell what your co-pay for medications will be in advance as this is different depending on your insurance coverage. However, we may be able to find a substitute medication at lower cost or fill out paperwork to get insurance to cover a needed medication.   If a prior authorization is required to get your medication covered by  your insurance company, please allow us  1-2 business days to complete this process.  Drug prices often vary depending on where the prescription is filled and some pharmacies may offer cheaper prices.  The website www.goodrx.com contains coupons for medications through different pharmacies. The prices here do not account for what the cost may be with help from insurance (it may be cheaper with your insurance), but the website can give you the price if you did not use any insurance.  - You can print the associated  coupon and take it with your prescription to the pharmacy.  - You may also stop by our office during regular business hours and pick up a GoodRx coupon card.  - If you need your prescription sent electronically to a different pharmacy, notify our office through [facility] or by phone at [phone] option 4.     Si Usted Necesita Algo Despus de Su Visita  Tambin puede enviarnos un mensaje a travs de Clinical cytogeneticist. Por lo general respondemos a los mensajes de MyChart en el transcurso de 1 a 2 das hbiles.  Para renovar recetas, por favor pida a su farmacia que se ponga en contacto con nuestra oficina. Randi lakes de fax es [place] [phone].  Si tiene un asunto urgente cuando la clnica est cerrada y que no puede esperar hasta el siguiente da hbil, puede llamar/localizar a su doctor(a) al nmero que aparece a continuacin.   Por favor, tenga en cuenta que aunque hacemos todo lo posible para estar disponibles para asuntos urgentes fuera del horario de [place], no estamos disponibles las 24 horas del da, los 7 [address]  [address] de la [place].   Si tiene un problema urgente y no puede comunicarse con nosotros, puede optar por buscar atencin mdica  en el consultorio de su doctor(a), en una clnica privada, en un centro de atencin urgente o en una sala de emergencias.  Si tiene Engineer, drilling, por favor llame inmediatamente al 911 o vaya a la sala de emergencias.  Nmeros de bper  - Dr. Hester: [phone]  - Dra. Jackquline: [phone]  - Dr. Claudene: [phone]  - Dra. Kitts: [phone]  En caso de inclemencias del [place], por favor llame a nuestra lnea principal al [phone] para una actualizacin sobre el estado de cualquier retraso o cierre.  Consejos para la medicacin en dermatologa: Por favor, guarde las cajas en las que vienen los medicamentos de uso tpico para ayudarle a seguir las instrucciones sobre dnde y cmo usarlos. Las farmacias generalmente imprimen  las instrucciones del medicamento slo en las cajas y no directamente en los tubos del [place].   Si su medicamento es muy caro, por favor, pngase en contacto con landry rieger llamando al [phone] y presione la opcin 4 o envenos un mensaje a travs de Clinical cytogeneticist.   No podemos decirle cul ser su copago por los medicamentos por adelantado ya que esto es diferente dependiendo de la cobertura de su seguro. Sin embargo, es posible que podamos encontrar un medicamento sustituto a Audiological scientist un formulario para que el seguro cubra el medicamento que se considera necesario.   Si se requiere una autorizacin previa para que su compaa de seguros malta su medicamento, por favor permtanos de 1 a 2 das hbiles para completar este proceso.  Los precios de los medicamentos varan con frecuencia dependiendo del Environmental consultant de dnde se surte la receta y alguna farmacias pueden ofrecer precios ms baratos.  El sitio web www.goodrx.com tiene cupones para medicamentos de Health and safety inspector.  Los precios aqu no tienen en cuenta lo que podra costar con la ayuda del seguro (puede ser ms barato con su seguro), pero el sitio web puede darle el precio si no utiliz Tourist information centre manager.  - Puede imprimir el cupn correspondiente y llevarlo con su receta a la farmacia.  - Tambin puede pasar por nuestra oficina durante el horario de atencin regular y Education officer, museum una tarjeta de cupones de GoodRx.  - Si necesita que su receta se enve electrnicamente a una farmacia diferente, informe a nuestra oficina a travs de MyChart de  o por telfono llamando al [phone] y presione la opcin 4.

## 2024-04-20 NOTE — Progress Notes (Signed)
   Follow-Up Visit   Subjective  Theresa Skinner is a 48 y.o. female who presents for the following: Rosacea face, 9wk f/u, SM Triple cream bid, improved, Seb derm face, 9wk f/u, Ketoconazole  2% cr bid, nasal ala still scaly   The following portions of the chart were reviewed this encounter and updated as appropriate: medications, allergies, medical history  Review of Systems:  No other skin or systemic complaints except as noted in HPI or Assessment and Plan.  Objective  Well appearing patient in no apparent distress; mood and affect are within normal limits.   A focused examination was performed of the following areas: face  Relevant exam findings are noted in the Assessment and Plan.    Assessment & Plan   ROSACEA Exam Mid face erythema, improved, resolution of inflammatory papules  Chronic and persistent condition with duration or expected duration over one year. Condition is improving with treatment, at goal.   Rosacea is a chronic progressive skin condition usually affecting the face of adults, causing redness and/or acne bumps. It is treatable but not curable. It sometimes affects the eyes (ocular rosacea) as well. It may respond to topical and/or systemic medication and can flare with stress, sun exposure, alcohol, exercise, topical steroids (including hydrocortisone/cortisone 10) and some foods.  Daily application of broad spectrum spf 30+ sunscreen to face is recommended to reduce flares.   Treatment Plan Cont SM Triple cream bid to face  Long term medication management.  Patient is using long term (months to years) prescription medication  to control their dermatologic condition.  These medications require periodic monitoring to evaluate for efficacy and side effects and may require periodic laboratory monitoring.   SEBORRHEIC DERMATITIS Nasal ala Exam: Pink patches with scale at nasal alar creases  Chronic and persistent condition with duration or expected  duration over one year. Condition is symptomatic/ bothersome to patient. Not currently at goal.   Seborrheic Dermatitis is a chronic persistent rash characterized by pinkness and scaling most commonly of the mid face but also can occur on the scalp (dandruff), ears; mid chest, mid back and groin.  It tends to be exacerbated by stress and cooler weather.  People who have neurologic disease may experience new onset or exacerbation of existing seborrheic dermatitis.  The condition is not curable but treatable and can be controlled.  Treatment Plan: Cont Ketoconazole  2% cr bid Start HC 2.5% cr bid until area smooth for up to 2 wks, then prn flares, if not better send mychart  Topical steroids (such as triamcinolone , fluocinolone, fluocinonide, mometasone, clobetasol, halobetasol, betamethasone, hydrocortisone) can cause thinning and lightening of the skin if they are used for too long in the same area. Your physician has selected the right strength medicine for your problem and area affected on the body. Please use your medication only as directed by your physician to prevent side effects.     Long term medication management.  Patient is using long term (months to years) prescription medication  to control their dermatologic condition.  These medications require periodic monitoring to evaluate for efficacy and side effects and may require periodic laboratory monitoring.  ROSACEA   SEBORRHEIC DERMATITIS    Return for as scheduled.  I, Grayce Saunas, RMA, am acting as scribe for Boneta Sharps, MD .   Documentation: I have reviewed the above documentation for accuracy and completeness, and I agree with the above.  Boneta Sharps, MD

## 2024-05-07 ENCOUNTER — Other Ambulatory Visit: Payer: Self-pay | Admitting: Obstetrics and Gynecology

## 2024-05-08 ENCOUNTER — Other Ambulatory Visit: Payer: Self-pay

## 2024-05-08 ENCOUNTER — Other Ambulatory Visit: Payer: Self-pay | Admitting: Obstetrics and Gynecology

## 2024-05-08 MED ORDER — MICROGESTIN 24 FE 1-20 MG-MCG PO TABS
1.0000 | ORAL_TABLET | Freq: Every day | ORAL | 1 refills | Status: AC
  Filled 2024-05-08: qty 84, 84d supply, fill #0

## 2024-05-11 ENCOUNTER — Other Ambulatory Visit: Payer: Self-pay

## 2024-05-11 MED ORDER — ALPRAZOLAM 0.25 MG PO TABS
0.2500 mg | ORAL_TABLET | Freq: Two times a day (BID) | ORAL | 0 refills | Status: AC | PRN
  Filled 2024-05-11: qty 30, 15d supply, fill #0

## 2024-05-11 NOTE — Progress Notes (Signed)
 ENCOUNTER: Patient Class :No patient class for patient encounter Department: [facility] CLINIC [address] [place] [address]  PATIENT: Patient Demographics      Name Patient ID SSN Gender Identity Birth Date   Theresa Skinner, Theresa Skinner [id number] Female September 10, 1975 (48 yrs)          Address Phone Email       [address] [place] [address] [phone] (H) [email or web address]            [place] Race         [facility] Caucasian/White             Reg Status PCP Date Last Verified Next Review Date     ELAPSED Harvey Gaetana Helling [id number] 03/21/24 04/20/24           Marital Status Religion Language       Married Unknown-Patient Declined English              EMERGENCY CONTACT: Name Relationship Lgl Grd Work Marine Scientist Phone  1. Burress,SHANE Spouse    [phone]    GUARANTOR: There is no guarantor information entered for this encounter.  COVERAGE: Primary Visit Coverage      Payer Plan Group Number Group Name Payer Phone Plan Phone   No coverage found                Secondary Visit Coverage      Payer Plan Group Number Group Name Payer Phone Plan Phone   No coverage found                Primary Coverage      Payer Plan Group Number Group Name Payer Phone Plan Phone   AETNA AETNA-CONE [facility] SAVE PLAN [id number] [employer] [facility]  [phone]           Primary Subscriber      Subscriber ID Subscriber Name Subscriber [facility] Subscriber Address   [id number] [facility] A [id number] [address]      [place], [place] [address]           Secondary Coverage      Payer Plan Group Number Group Name Payer Phone Plan Phone   No coverage found

## 2024-06-02 ENCOUNTER — Other Ambulatory Visit: Payer: Self-pay

## 2024-06-02 MED ORDER — FLUCONAZOLE 150 MG PO TABS
150.0000 mg | ORAL_TABLET | Freq: Once | ORAL | 1 refills | Status: AC
  Filled 2024-06-02: qty 1, 1d supply, fill #0

## 2024-06-02 MED ORDER — AMOXICILLIN-POT CLAVULANATE 875-125 MG PO TABS
1.0000 | ORAL_TABLET | Freq: Two times a day (BID) | ORAL | 0 refills | Status: AC
  Filled 2024-06-02: qty 20, 10d supply, fill #0

## 2024-06-19 ENCOUNTER — Ambulatory Visit

## 2024-06-19 DIAGNOSIS — D1801 Hemangioma of skin and subcutaneous tissue: Secondary | ICD-10-CM | POA: Diagnosis not present

## 2024-06-19 DIAGNOSIS — L814 Other melanin hyperpigmentation: Secondary | ICD-10-CM

## 2024-06-19 DIAGNOSIS — Z1283 Encounter for screening for malignant neoplasm of skin: Secondary | ICD-10-CM

## 2024-06-19 DIAGNOSIS — L719 Rosacea, unspecified: Secondary | ICD-10-CM | POA: Diagnosis not present

## 2024-06-19 DIAGNOSIS — L821 Other seborrheic keratosis: Secondary | ICD-10-CM

## 2024-06-19 DIAGNOSIS — L578 Other skin changes due to chronic exposure to nonionizing radiation: Secondary | ICD-10-CM

## 2024-06-19 DIAGNOSIS — W908XXA Exposure to other nonionizing radiation, initial encounter: Secondary | ICD-10-CM | POA: Diagnosis not present

## 2024-06-19 DIAGNOSIS — Z808 Family history of malignant neoplasm of other organs or systems: Secondary | ICD-10-CM

## 2024-06-19 DIAGNOSIS — D229 Melanocytic nevi, unspecified: Secondary | ICD-10-CM

## 2024-06-19 NOTE — Patient Instructions (Addendum)
 Sunscreen  Who needs sunscreen? Everyone. Sunscreen use can help prevent skin cancer by protecting you from the sun's harmful ultraviolet rays. Anyone can get skin cancer, regardless of age, gender or race. In fact, it is estimated that one in five Americans will develop skin cancer in their lifetime.  Sunscreen alone cannot fully protect you. In addition to wearing sunscreen, dermatologists recommend taking the following steps to protect your skin and find skin cancer early:  Seek shade when appropriate, remembering that the sun's rays are strongest between 10 a.m. and 2 p.m. If your shadow is shorter than you are, seek shade. Dress to protect yourself from the sun by wearing a lightweight long-sleeved shirt, pants, a wide-brimmed hat and sunglasses, when possible.  Use extra caution near water , snow and sand as they reflect the damaging rays of the sun, which can increase your chance of sunburn.  Get vitamin D  safely through a healthy diet that may include vitamin supplements. Don't seek the sun. Avoid tanning beds. Ultraviolet light from the sun and tanning beds can cause skin cancer and wrinkling. If you want to look tan, you may wish to use a self-tanning product, but continue to use sunscreen with it.  When should I use sunscreen? Every day you go outside--even if you're just walking to and from your form of transportation. The sun emits harmful UV rays year-round. Even on cloudy days, up to 80 percent of the sun's harmful UV rays can penetrate your skin. Snow, sand and water  increase the need for sunscreen because they reflect the sun's rays.  How much sunscreen should I use, and how often should I apply it? Most people only apply 25-50 percent of the recommended amount of sunscreen. Apply enough sunscreen to cover all exposed skin. Most adults need about 1 ounce -- or enough to fill a shot glass -- to fully cover their body.  Don't forget to apply to the tops of your feet, your neck, your ears  and the top of your head. Apply sunscreen to dry skin 15 minutes before going outdoors.  Skin cancer also can form on the lips. To protect your lips, apply a lip balm or lipstick that contains sunscreen with an SPF of 30 or higher.  When outdoors, reapply sunscreen approximately every two hours, or after swimming or sweating, according to the directions on the bottle.   Broad-spectrum sunscreens protect against both UVA and UVB rays. What is the difference between the rays? Sunlight consists of two types of harmful rays that reach the earth -- UVA rays and UVB rays. Overexposure to either can lead to skin cancer. In addition to causing skin cancer, here's what each of these rays do:  UVA rays (or aging rays) can prematurely age your skin, causing wrinkles and age spots, and can pass through window glass. UVB rays (or burning rays) are the primary cause of sunburn and are blocked by window glass  There is no safe way to tan. Every time you tan, you damage your skin. As this damage builds, you speed up the aging of your skin and increase your risk for all types of skin cancer.  What is the difference between chemical and physical sunscreens? Chemical sunscreens work like a sponge, absorbing the sun's rays. They contain one or more of the following active ingredients: oxybenzone, avobenzone, octisalate, octocrylene, homosalate and octinoxate. These formulations tend to be easier to rub into the skin without leaving a white residue.   Physical sunscreens work like a shield,  sitting sit on the surface of your skin and deflecting the sun's rays. They contain the active ingredients zinc oxide and/or titanium dioxide. Use this sunscreen if you have sensitive skin.   What type of sunscreen should I use? The best type of sunscreen is the one you will use again and again. Just make sure it offers broad-spectrum (UVA and UVB) protection, has an SPF of 30+, and is water -resistant. The kind of sunscreen you use is  a matter of personal choice, and may vary depending on the area of the body to be protected. Available sunscreen options include lotions, creams, gels, ointments, wax sticks and sprays.  Recommended physical sunscreens for face: - Neutrogena Sheer Zinc - Aveeno Positively Mineral Sensitive - CeraVe Hydrating Mineral (also has a tinted version) - La Roche-Posay Anthelios Mineral Face (comes as a cream, lotion, light fluid, and there is also a tinted version).  - EltaMD UV Clear (also has a tinted version)  Recommended physical sunscreens for body: - Neutrogena Sheer Zinc Dry-Touch Sunscreen Sensitive Skin Lotion Broad Spectrum SPF 50 - Aveeno Positively Mineral Sensitive Skin Sunscreen Broad Spectrum SPF 50 - La Roche-Posay Anthelios SPF 50 Mineral Sunscreen - Gentle Lotion - CeraVe Hydrating Mineral Sunscreen SPF 50  Recommended chemical sunscreens for face: - Anthelios UV Correct Face Sunscreen SPF 70 with Niacinamide - Neutrogena Clear Face Oil-Free SPF 50 with Helioplex - Neutrogena Sport Face Oil-Free SPF 70+ with Helioplex - Aveeno Protect + Hydrate Sunscreen For Face SPF 70 - La Roche-Posay Anthelios Light Fluid Sunscreen for Face SPF 60  Recommended chemical sunscreens for body: - Neutrogena Ultra Sheer Dry-Touch Sunscreen SPF 70 - Aveeno Protect + Hydrate Broad Spectrum All-Day Hydration SPF 60 (comes in a big pump) - La Roche-Posay Anthelios Melt-In Milk Sunscreen SPF 60    Due to recent changes in healthcare laws, you may see results of your pathology and/or laboratory studies on MyChart before the doctors have had a chance to review them. We understand that in some cases there may be results that are confusing or concerning to you. Please understand that not all results are received at the same time and often the doctors may need to interpret multiple results in order to provide you with the best plan of care or course of treatment. Therefore, we ask that you please give us  2  business days to thoroughly review all your results before contacting the office for clarification. Should we see a critical lab result, you will be contacted sooner.   If You Need Anything After Your Visit  If you have any questions or concerns for your doctor, please call our main line at [phone] and press option 4 to reach your doctor's medical assistant. If no one answers, please leave a voicemail as directed and we will return your call as soon as possible. Messages left after 4 pm will be answered the following business day.   You may also send us  a message via MyChart. We typically respond to MyChart messages within 1-2 business days.  For prescription refills, please ask your pharmacy to contact our office. Our fax number is [phone].  If you have an urgent issue when the clinic is closed that cannot wait until the next business day, you can page your doctor at the number below.    Please note that while we do our best to be available for urgent issues outside of office hours, we are not available 24/7.   If you have an urgent issue and are  unable to reach us , you may choose to seek medical care at your doctor's office, retail clinic, urgent care center, or emergency room.  If you have a medical emergency, please immediately call 911 or go to the emergency department.  Pager Numbers  - Dr. Hester: [phone]  - Dr. Jackquline: [phone]  - Dr. Claudene: [phone]   In the event of inclement weather, please call our main line at [phone] for an update on the status of any delays or closures.  Dermatology Medication Tips: Please keep the boxes that topical medications come in in order to help keep track of the instructions about where and how to use these. Pharmacies typically print the medication instructions only on the boxes and not directly on the medication tubes.   If your medication is too expensive, please contact our office at [phone] option 4 or  send us  a message through MyChart.   We are unable to tell what your co-pay for medications will be in advance as this is different depending on your insurance coverage. However, we may be able to find a substitute medication at lower cost or fill out paperwork to get insurance to cover a needed medication.   If a prior authorization is required to get your medication covered by your insurance company, please allow us  1-2 business days to complete this process.  Drug prices often vary depending on where the prescription is filled and some pharmacies may offer cheaper prices.  The website www.goodrx.com contains coupons for medications through different pharmacies. The prices here do not account for what the cost may be with help from insurance (it may be cheaper with your insurance), but the website can give you the price if you did not use any insurance.  - You can print the associated coupon and take it with your prescription to the pharmacy.  - You may also stop by our office during regular business hours and pick up a GoodRx coupon card.  - If you need your prescription sent electronically to a different pharmacy, notify our office through [facility] or by phone at [phone] option 4.     Si Usted Necesita Algo Despus de Su Visita  Tambin puede enviarnos un mensaje a travs de Clinical cytogeneticist. Por lo general respondemos a los mensajes de MyChart en el transcurso de 1 a 2 das hbiles.  Para renovar recetas, por favor pida a su farmacia que se ponga en contacto con nuestra oficina. Randi lakes de fax es [place] [phone].  Si tiene un asunto urgente cuando la clnica est cerrada y que no puede esperar hasta el siguiente da hbil, puede llamar/localizar a su doctor(a) al nmero que aparece a continuacin.   Por favor, tenga en cuenta que aunque hacemos todo lo posible para estar disponibles para asuntos urgentes fuera del horario de [place], no estamos disponibles las 24 horas del  da, los 7 [address]  [address] de la [place].   Si tiene un problema urgente y no puede comunicarse con nosotros, puede optar por buscar atencin mdica  en el consultorio de su doctor(a), en una clnica privada, en un centro de atencin urgente o en una sala de emergencias.  Si tiene Engineer, drilling, por favor llame inmediatamente al 911 o vaya a la sala de emergencias.  Nmeros de bper  - Dr. Hester: [phone]  - Dra. Jackquline: [phone]  - Dr. Claudene: [phone]   En caso de inclemencias del tiempo, por favor llame a landry capes principal al [phone] para ignacia actualizacin sobre  el estado de cualquier retraso o cierre.  Consejos para la medicacin en dermatologa: Por favor, guarde las cajas en las que vienen los medicamentos de uso tpico para ayudarle a seguir las instrucciones sobre dnde y cmo usarlos. Las farmacias generalmente imprimen las instrucciones del medicamento slo en las cajas y no directamente en los tubos del [place].   Si su medicamento es muy caro, por favor, pngase en contacto con landry rieger llamando al [phone] y presione la opcin 4 o envenos un mensaje a travs de Clinical cytogeneticist.   No podemos decirle cul ser su copago por los medicamentos por adelantado ya que esto es diferente dependiendo de la cobertura de su seguro. Sin embargo, es posible que podamos encontrar un medicamento sustituto a Audiological scientist un formulario para que el seguro cubra el medicamento que se considera necesario.   Si se requiere una autorizacin previa para que su compaa de seguros malta su medicamento, por favor permtanos de 1 a 2 das hbiles para completar este proceso.  Los precios de los medicamentos varan con frecuencia dependiendo del Environmental consultant de dnde se surte la receta y alguna farmacias pueden ofrecer precios ms baratos.  El sitio web www.goodrx.com tiene cupones para medicamentos de Health and safety inspector. Los precios aqu no tienen en cuenta lo que podra  costar con la ayuda del seguro (puede ser ms barato con su seguro), pero el sitio web puede darle el precio si no utiliz Tourist information centre manager.  - Puede imprimir el cupn correspondiente y llevarlo con su receta a la farmacia.  - Tambin puede pasar por nuestra oficina durante el horario de atencin regular y Education officer, museum una tarjeta de cupones de GoodRx.  - Si necesita que su receta se enve electrnicamente a una farmacia diferente, informe a nuestra oficina a travs de MyChart de [place] o por telfono llamando al [phone] y presione la opcin 4.

## 2024-06-19 NOTE — Progress Notes (Signed)
 "   Subjective   Theresa Skinner is a 49 y.o. female who presents for the following: Total body skin exam for skin cancer screening and mole check. The patient has spots, moles and lesions to be evaluated, some may be new or changing and the patient may have concern these could be cancer. Patient is established patient   Today patient reports: Hx of rosacea and seb derm.  No personal hx of skin cancer, family history of skin cancer - Sister (melanoma), Father (unknown type).   Review of Systems:    No other skin or systemic complaints except as noted in HPI or Assessment and Plan.  The following portions of the chart were reviewed this encounter and updated as appropriate: medications, allergies, medical history  Relevant Medical History:  Family history of skin cancer - Sister (melanoma), Father (unknown type)   Objective  (SKPE) Well appearing patient in no apparent distress; mood and affect are within normal limits. Examination was performed of the: Full Skin Examination: scalp, head, eyes, ears, nose, lips, neck, chest, axillae, abdomen, back, buttocks, bilateral upper extremities, bilateral lower extremities, hands, feet, fingers, toes, fingernails, and toenails.   Examination notable for: SKIN EXAM, Angioma(s): Scattered red vascular papule(s)  , Lentigo/lentigines: Scattered pigmented macules that are tan to brown in color and are somewhat non-uniform in shape and concentrated in the sun-exposed areas, Nevus/nevi: Scattered well-demarcated, regular, pigmented macule(s) and/or papule(s)  , Seborrheic Keratosis(es): Stuck-on appearing keratotic papule(s) on the trunk, none  irritated with redness, crusting, edema, and/or partial avulsion, Actinic Damage/Elastosis: chronic sun damage: dyspigmentation, telangiectasia, and wrinkling  Examination limited by: Undergarments     Assessment & Plan  (SKAP)   SKIN CANCER SCREENING PERFORMED TODAY.  BENIGN SKIN FINDINGS  - Lentigines  -  Seborrheic keratoses  - Hemangiomas   - Nevus/Multiple Benign Nevi  - Reassurance provided regarding the benign appearance of lesions noted on exam today; no treatment is indicated in the absence of symptoms/changes. - Reinforced importance of photoprotective strategies including liberal and frequent sunscreen use of a broad-spectrum SPF 30 or greater, use of protective clothing, and sun avoidance for prevention of cutaneous malignancy and photoaging.  Counseled patient on the importance of regular self-skin monitoring as well as routine clinical skin examinations as scheduled.   ACTINIC DAMAGE - Chronic condition, secondary to cumulative UV/sun exposure - Recommend daily broad spectrum sunscreen SPF 30+ to sun-exposed areas, reapply every 2 hours as needed.  - Staying in the shade or wearing long sleeves, sun glasses (UVA+UVB protection) and wide brim hats (4-inch brim around the entire circumference of the hat) are also recommended for sun protection.  - Call for new or changing lesions.  Personal history of family history of melanoma - Reviewed medical history for full details  - Reviewed sun protective measures as above - Encouraged full body skin exams     Rosacea Chronic and persistent condition with duration or expected duration over one year. Condition is symptomatic and bothersome to patient. Patient is flaring and not currently at treatment goal.   - Reviewed etiology and probable recurrent nature of this condition - Discussed potential triggers including caffeine, heat, sun exposure, chocolate, etc. and recommended patient attempt to identify and avoid triggers - Discussed lasers are best treatment for redness.  - Sun avoidance, protective clothing sunscreen discussed - Telangiectasias will likely not fade completely, laser removal may be considered as a cosmetic procedure   Was sun protection counseling provided?: Yes   Level of service  outlined above   Patient instructions  (SKPI)   Procedures, orders, diagnosis for this visit:    There are no diagnoses linked to this encounter.  Return to clinic: Return in about 1 year (around 06/19/2025) for TBSE, w/ Dr. Raymund.  LILLETTE Lonell ROCKFORD Wilfred, CMA, am acting as scribe for Lauraine JAYSON Raymund, MD.  Documentation: I have reviewed the above documentation for accuracy and completeness, and I agree with the above.  Lauraine JAYSON Raymund, MD  "

## 2025-06-25 ENCOUNTER — Encounter
